# Patient Record
Sex: Female | Born: 1990
Health system: Southern US, Community
[De-identification: ages and names within clinical notes are randomized; demographics above are authoritative.]

## PROBLEM LIST (undated history)

## (undated) ENCOUNTER — Inpatient Hospital Stay (HOSPITAL_COMMUNITY): Payer: Self-pay

## (undated) DIAGNOSIS — J45909 Unspecified asthma, uncomplicated: Secondary | ICD-10-CM

## (undated) HISTORY — PX: NO PAST SURGERIES: SHX2092

---

## 2020-01-29 ENCOUNTER — Encounter (HOSPITAL_COMMUNITY): Payer: Self-pay

## 2020-01-29 ENCOUNTER — Other Ambulatory Visit: Payer: Self-pay

## 2020-01-29 ENCOUNTER — Ambulatory Visit (HOSPITAL_COMMUNITY)
Admission: EM | Admit: 2020-01-29 | Discharge: 2020-01-29 | Disposition: A | Discharge status: Home / Self Care | Payer: Federal, State, Local not specified - PPO | Attending: Emergency Medicine | Admitting: Emergency Medicine

## 2020-01-29 DIAGNOSIS — M109 Gout, unspecified: Secondary | ICD-10-CM

## 2020-01-29 HISTORY — DX: Unspecified asthma, uncomplicated: J45.909

## 2020-01-29 MED ORDER — METHYLPREDNISOLONE SODIUM SUCC 125 MG IJ SOLR
125.0000 mg | Freq: Once | INTRAMUSCULAR | Status: AC
  Administered 2020-01-29: 125 mg via INTRAMUSCULAR

## 2020-01-29 MED ORDER — METHYLPREDNISOLONE SODIUM SUCC 125 MG IJ SOLR
INTRAMUSCULAR | Status: AC
  Filled 2020-01-29: qty 2

## 2020-01-29 MED ORDER — KETOROLAC TROMETHAMINE 60 MG/2ML IM SOLN
INTRAMUSCULAR | Status: AC
  Filled 2020-01-29: qty 2

## 2020-01-29 MED ORDER — KETOROLAC TROMETHAMINE 60 MG/2ML IM SOLN
60.0000 mg | Freq: Once | INTRAMUSCULAR | Status: AC
  Administered 2020-01-29: 60 mg via INTRAMUSCULAR

## 2020-01-29 MED ORDER — PREDNISONE 10 MG (21) PO TBPK
ORAL_TABLET | ORAL | 0 refills | Status: DC
Start: 2020-01-29 — End: 2020-06-30

## 2020-01-29 MED ORDER — IBUPROFEN 600 MG PO TABS
600.0000 mg | ORAL_TABLET | Freq: Four times a day (QID) | ORAL | 0 refills | Status: DC | PRN
Start: 2020-01-29 — End: 2020-06-30

## 2020-01-29 NOTE — ED Provider Notes (Signed)
[facility]    CSN: [id number] Arrival date & time: 01/29/20  1728      History   Chief Complaint Chief Complaint  Patient presents with  . Foot Pain    HPI Stephanie Santos is a 29 y.o. female.   Who presented to the urgent care with a complaint of left ankle pain for the past 2 days.   Denies any precipitating event.  Localized the pain to the left ankle.  Described the pain as constant and achy, rated at 10 on a scale 1-10.  Has not tried any OTC medication..  Her symptoms are made worse with range of motion.  Denies chills, fever, nausea, vomiting, diarrhea.  The history is provided by the patient. No language interpreter was used.  Foot Pain    Past Medical History:  Diagnosis Date  . Asthma     There are no problems to display for this patient.   History reviewed. No pertinent surgical history.  OB History   No obstetric history on file.      Home Medications    Prior to Admission medications   Medication Sig Start Date End Date Taking? Authorizing Provider  ibuprofen (ADVIL) 600 MG tablet Take 1 tablet (600 mg total) by mouth every 6 (six) hours as needed. 01/29/20   Daylen Hack, Zachery Dakins, FNP  predniSONE (STERAPRED UNI-PAK 21 TAB) 10 MG (21) TBPK tablet Take 6 tabs by mouth daily  for 2 days, then 5 tabs for 2 days, then 4 tabs for 2 days, then 3 tabs for 2 days, 2 tabs for 2 days, then 1 tab by mouth daily for 2 days 01/29/20   Durward Parcel, FNP    Family History Family History  Problem Relation Age of Onset  . Cancer Mother     Social History Social History   Tobacco Use  . Smoking status: Never Smoker  . Smokeless tobacco: Never Used  Substance Use Topics  . Alcohol use: Never  . Drug use: Never     Allergies   Patient has no known allergies.   Review of Systems Review of Systems  Constitutional: Negative.   Respiratory: Negative.   Cardiovascular: Negative.   Musculoskeletal: Positive for arthralgias.  All other systems  reviewed and are negative.    Physical Exam Triage Vital Signs ED Triage Vitals  Enc Vitals Group     BP 01/29/20 1821 126/79     Pulse Rate 01/29/20 1821 81     Resp 01/29/20 1821 18     Temp 01/29/20 1821 98.9 F (37.2 C)     Temp Source 01/29/20 1821 Oral     SpO2 01/29/20 1821 100 %     Weight --      Height --      Head Circumference --      Peak Flow --      Pain Score 01/29/20 1820 8     Pain Loc --      Pain Edu? --      Excl. in GC? --    No data found.  Updated Vital Signs BP 126/79 (BP Location: Left Arm)   Pulse 81   Temp 98.9 F (37.2 C) (Oral)   Resp 18   LMP 01/03/2020   SpO2 100%   Visual Acuity Right Eye Distance:   Left Eye Distance:   Bilateral Distance:    Right Eye Near:   Left Eye Near:    Bilateral Near:     Physical  Exam Vitals and nursing note reviewed.  Constitutional:      General: She is not in acute distress.    Appearance: Normal appearance. She is normal weight. She is not ill-appearing, toxic-appearing or diaphoretic.  Cardiovascular:     Rate and Rhythm: Normal rate and regular rhythm.     Pulses: Normal pulses.     Heart sounds: Normal heart sounds. No murmur. No friction rub. No gallop.   Pulmonary:     Effort: Pulmonary effort is normal. No respiratory distress.     Breath sounds: Normal breath sounds. No stridor. No wheezing, rhonchi or rales.  Chest:     Chest wall: No tenderness.  Musculoskeletal:        General: Tenderness present.     Right ankle: Normal.     Left ankle: Swelling present. No ecchymosis or lacerations. Tenderness present.     Comments: Patient is unable to bear weight.  Left ankle is with deformity when compared to the right ankle.  There is warmth present to touch.  Patient is unable to complete range of motion due to pain.  Neurovascular status is intact.  Neurological:     Mental Status: She is alert.      UC Treatments / Results  Labs (all labs ordered are listed, but only abnormal  results are displayed) Labs Reviewed - No data to display  EKG   Radiology No results found.  Procedures Procedures (including critical care time)  Medications Ordered in UC Medications  methylPREDNISolone sodium succinate (SOLU-MEDROL) 125 mg/2 mL injection 125 mg (has no administration in time range)  ketorolac (TORADOL) injection 60 mg (has no administration in time range)    Initial Impression / Assessment and Plan / UC Course  I have reviewed the triage vital signs and the nursing notes.  Pertinent labs & imaging results that were available during my care of the patient were reviewed by me and considered in my medical decision making (see chart for details).   Patient is stable for discharge.  Symptom is likely from gout.  Will prescribe prednisone and ibuprofen.  Follow-up with PCP  Final Clinical Impressions(s) / UC Diagnoses   Final diagnoses:  Acute gout of left ankle, unspecified cause     Discharge Instructions     Prescribed prednisone take as directed and to completion Prescribed ibuprofen Primary care provider assistance initiated to establish care Follow up with PCP for further evaluation and management Return or go to the ED if you have any new or worsening symptoms     ED Prescriptions    Medication Sig Dispense Auth. Provider   predniSONE (STERAPRED UNI-PAK 21 TAB) 10 MG (21) TBPK tablet Take 6 tabs by mouth daily  for 2 days, then 5 tabs for 2 days, then 4 tabs for 2 days, then 3 tabs for 2 days, 2 tabs for 2 days, then 1 tab by mouth daily for 2 days 42 tablet Thermon Zulauf S, FNP   ibuprofen (ADVIL) 600 MG tablet Take 1 tablet (600 mg total) by mouth every 6 (six) hours as needed. 30 tablet Junia Nygren, Darrelyn Hillock, FNP     PDMP not reviewed this encounter.   Emerson Monte, FNP 01/29/20 1927

## 2020-01-29 NOTE — Discharge Instructions (Addendum)
Prescribed prednisone take as directed and to completion Prescribed ibuprofen Primary care provider assistance initiated to establish care Follow up with PCP for further evaluation and management Return or go to the ED if you have any new or worsening symptoms

## 2020-01-29 NOTE — ED Triage Notes (Signed)
Pt c/o left ankle/foot pain since yesterday. Denies any known trauma/injury. Left foot/ankle edematous. Pt reports that she is unable to bear weight to foot/ankle. Has NOT taken any pain relievers/NSAIDs.

## 2020-02-02 ENCOUNTER — Other Ambulatory Visit: Payer: Self-pay

## 2020-02-02 ENCOUNTER — Emergency Department (HOSPITAL_COMMUNITY)
Admission: EM | Admit: 2020-02-02 | Discharge: 2020-02-03 | Disposition: A | Discharge status: Home / Self Care | Payer: Federal, State, Local not specified - PPO | Attending: Emergency Medicine | Admitting: Emergency Medicine

## 2020-02-02 ENCOUNTER — Emergency Department (HOSPITAL_COMMUNITY): Payer: Federal, State, Local not specified - PPO

## 2020-02-02 ENCOUNTER — Encounter (HOSPITAL_COMMUNITY): Payer: Self-pay | Admitting: Emergency Medicine

## 2020-02-02 DIAGNOSIS — R079 Chest pain, unspecified: Secondary | ICD-10-CM | POA: Insufficient documentation

## 2020-02-02 DIAGNOSIS — Z5321 Procedure and treatment not carried out due to patient leaving prior to being seen by health care provider: Secondary | ICD-10-CM | POA: Diagnosis not present

## 2020-02-02 LAB — BASIC METABOLIC PANEL
Anion gap: 10 (ref 5–15)
BUN: 14 mg/dL (ref 6–20)
CO2: 26 mmol/L (ref 22–32)
Calcium: 8.6 mg/dL — ABNORMAL LOW (ref 8.9–10.3)
Chloride: 102 mmol/L (ref 98–111)
Creatinine, Ser: 0.9 mg/dL (ref 0.44–1.00)
GFR calc Af Amer: >60 mL/min (ref >60)
GFR calc non Af Amer: >60 mL/min (ref >60)
Glucose, Bld: 126 mg/dL — ABNORMAL HIGH (ref 70–99)
Potassium: 3.8 mmol/L (ref 3.5–5.1)
Sodium: 138 mmol/L (ref 135–145)

## 2020-02-02 LAB — TROPONIN I (HIGH SENSITIVITY): Troponin I (High Sensitivity): <2 ng/L (ref <18)

## 2020-02-02 LAB — CBC
HCT: 34.6 % — ABNORMAL LOW (ref 36.0–46.0)
Hemoglobin: 10.5 g/dL — ABNORMAL LOW (ref 12.0–15.0)
MCH: 21.2 pg — ABNORMAL LOW (ref 26.0–34.0)
MCHC: 30.3 g/dL (ref 30.0–36.0)
MCV: 69.9 fL — ABNORMAL LOW (ref 80.0–100.0)
Platelets: 215 10*3/uL (ref 150–400)
RBC: 4.95 MIL/uL (ref 3.87–5.11)
RDW: 18.6 % — ABNORMAL HIGH (ref 11.5–15.5)
WBC: 8.3 10*3/uL (ref 4.0–10.5)
nRBC: 0 % (ref 0.0–0.2)

## 2020-02-02 LAB — I-STAT BETA HCG BLOOD, ED (MC, WL, AP ONLY): I-stat hCG, quantitative: <5 m[IU]/mL (ref <5)

## 2020-02-02 MED ORDER — SODIUM CHLORIDE 0.9% FLUSH: 3.0000 mL | Freq: Once | INTRAVENOUS | Status: DC

## 2020-02-02 NOTE — ED Triage Notes (Signed)
Pt c/o intermittent chest pain x 2 days. Denies shortness of breath.

## 2020-02-03 NOTE — ED Notes (Signed)
Patient stated that she can no longer wait , left the ER .

## 2020-03-30 ENCOUNTER — Other Ambulatory Visit: Payer: Self-pay

## 2020-03-30 ENCOUNTER — Encounter (HOSPITAL_COMMUNITY): Payer: Self-pay | Admitting: Emergency Medicine

## 2020-03-30 DIAGNOSIS — T192XXA Foreign body in vulva and vagina, initial encounter: Secondary | ICD-10-CM | POA: Insufficient documentation

## 2020-03-30 DIAGNOSIS — Y999 Unspecified external cause status: Secondary | ICD-10-CM | POA: Insufficient documentation

## 2020-03-30 DIAGNOSIS — J45909 Unspecified asthma, uncomplicated: Secondary | ICD-10-CM | POA: Insufficient documentation

## 2020-03-30 DIAGNOSIS — Y939 Activity, unspecified: Secondary | ICD-10-CM | POA: Diagnosis not present

## 2020-03-30 DIAGNOSIS — Y9289 Other specified places as the place of occurrence of the external cause: Secondary | ICD-10-CM | POA: Insufficient documentation

## 2020-03-30 DIAGNOSIS — W458XXA Other foreign body or object entering through skin, initial encounter: Secondary | ICD-10-CM | POA: Diagnosis not present

## 2020-03-30 NOTE — ED Triage Notes (Signed)
Patient reports tampon in vagina since 1500. States cannot find string or feel it.

## 2020-03-31 ENCOUNTER — Emergency Department (HOSPITAL_COMMUNITY)
Admission: EM | Admit: 2020-03-31 | Discharge: 2020-03-31 | Disposition: A | Discharge status: Home / Self Care | Payer: Federal, State, Local not specified - PPO | Attending: Emergency Medicine | Admitting: Emergency Medicine

## 2020-03-31 DIAGNOSIS — Z01419 Encounter for gynecological examination (general) (routine) without abnormal findings: Secondary | ICD-10-CM

## 2020-03-31 NOTE — ED Provider Notes (Signed)
[facility]-EMERGENCY DEPT Provider Note   CSN: [id number] Arrival date & time: 03/30/20  2148     History Chief Complaint  Patient presents with  . Foreign Body in Vagina    Stephanie Santos is a 29 y.o. female.  Patient to ED concerned for retained tampon in vaginal vault since around 3:00 yesterday afternoon. No vaginal discharge, odor or pain.  The history is provided by the patient. No language interpreter was used.  Foreign Body in Vagina This is a new problem. The current episode started 6 to 12 hours ago. The problem has not changed since onset.Pertinent negatives include no abdominal pain. Nothing aggravates the symptoms. Nothing relieves the symptoms. She has tried nothing for the symptoms.       Past Medical History:  Diagnosis Date  . Asthma     There are no problems to display for this patient.   History reviewed. No pertinent surgical history.   OB History   No obstetric history on file.     Family History  Problem Relation Age of Onset  . Cancer Mother     Social History   Tobacco Use  . Smoking status: Never Smoker  . Smokeless tobacco: Never Used  Vaping Use  . Vaping Use: Never used  Substance Use Topics  . Alcohol use: Never  . Drug use: Never    Home Medications Prior to Admission medications   Medication Sig Start Date End Date Taking? Authorizing Provider  ibuprofen (ADVIL) 600 MG tablet Take 1 tablet (600 mg total) by mouth every 6 (six) hours as needed. 01/29/20   Avegno, Zachery Dakins, FNP  predniSONE (STERAPRED UNI-PAK 21 TAB) 10 MG (21) TBPK tablet Take 6 tabs by mouth daily  for 2 days, then 5 tabs for 2 days, then 4 tabs for 2 days, then 3 tabs for 2 days, 2 tabs for 2 days, then 1 tab by mouth daily for 2 days 01/29/20   Durward Parcel, FNP    Allergies    Patient has no known allergies.  Review of Systems   Review of Systems  Gastrointestinal: Negative for abdominal pain.  Genitourinary: Negative for  pelvic pain and vaginal discharge.       See HPI.    Physical Exam Updated Vital Signs BP 122/69 (BP Location: Left Arm)   Pulse 62   Temp 97.9 F (36.6 C) (Oral)   Resp 14   LMP 03/30/2020   SpO2 100%   Physical Exam Constitutional:      Appearance: She is well-developed.  Pulmonary:     Effort: Pulmonary effort is normal.  Genitourinary:    Comments: Minimal cervical bleeding c/w menses. No foreign body observed in vaginal vault.  Musculoskeletal:     Cervical back: Normal range of motion.  Skin:    General: Skin is warm and dry.  Neurological:     Mental Status: She is alert and oriented to person, place, and time.     ED Results / Procedures / Treatments   Labs (all labs ordered are listed, but only abnormal results are displayed) Labs Reviewed - No data to display  EKG None  Radiology No results found.  Procedures Procedures (including critical care time)  Medications Ordered in ED Medications - No data to display  ED Course  I have reviewed the triage vital signs and the nursing notes.  Pertinent labs & imaging results that were available during my care of the patient were reviewed by me and  considered in my medical decision making (see chart for details).    MDM Rules/Calculators/A&P                          1. Well exam  Patient to ED with concern for vaginal FB. No FB on exam. No evidence of infection.   Final Clinical Impression(s) / ED Diagnoses Final diagnoses:  None    Rx / DC Orders ED Discharge Orders    None       Danne Harbor 03/31/20 0631    Palumbo, April, MD 03/31/20 [phone]

## 2020-03-31 NOTE — Discharge Instructions (Addendum)
Return to the emergency department or see your gynecologist if you develop any symptoms of vaginal discharge, vaginal odor, pain or fever. You are able to continue to use tampons without concern.

## 2020-06-30 ENCOUNTER — Encounter: Payer: Self-pay | Admitting: Family Medicine

## 2020-06-30 ENCOUNTER — Ambulatory Visit (INDEPENDENT_AMBULATORY_CARE_PROVIDER_SITE_OTHER): Payer: Federal, State, Local not specified - PPO | Admitting: Family Medicine

## 2020-06-30 ENCOUNTER — Other Ambulatory Visit: Payer: Self-pay

## 2020-06-30 VITALS — BP 120/70 | HR 76 | Ht 66.75 in | Wt 246.4 lb

## 2020-06-30 DIAGNOSIS — Z833 Family history of diabetes mellitus: Secondary | ICD-10-CM | POA: Insufficient documentation

## 2020-06-30 DIAGNOSIS — Z114 Encounter for screening for human immunodeficiency virus [HIV]: Secondary | ICD-10-CM

## 2020-06-30 DIAGNOSIS — Z0000 Encounter for general adult medical examination without abnormal findings: Secondary | ICD-10-CM | POA: Diagnosis not present

## 2020-06-30 DIAGNOSIS — E669 Obesity, unspecified: Secondary | ICD-10-CM | POA: Insufficient documentation

## 2020-06-30 DIAGNOSIS — Z1159 Encounter for screening for other viral diseases: Secondary | ICD-10-CM

## 2020-06-30 DIAGNOSIS — J45909 Unspecified asthma, uncomplicated: Secondary | ICD-10-CM | POA: Insufficient documentation

## 2020-06-30 HISTORY — DX: Obesity, unspecified: E66.9

## 2020-06-30 NOTE — Progress Notes (Signed)
Subjective:    Patient ID: Stephanie Santos, female    DOB: 11-12-1990, 29 y.o.   MRN: [id number]  HPI Chief Complaint  Patient presents with  . new pt    new pt, cpe, doesn't have a obgyn- just got insurance. no concerns. last pap smear 2020- [facility]- normal, declines flu shot   She is new to the practice and here for a complete physical exam. Previous medical care: no regular medical care  Last CPE: years   Other providers: [facility] Wants an OB/GYN  She would like to discuss how to lose weight.   Asthma since childhood but years since it was an issue.    Social history: has a boyfriend, lives alone, works in distribution center  Denies smoking, drinking alcohol, drug use  Diet: has cut back on fried foods, fast foods. Does not eat after 8 pm mostly  Excerise: nothing outside of woek   Immunizations: Tdap in the past 10 years.   Health maintenance:  Mammogram: N/A Colonoscopy: N/A Last Gynecological Exam: pap smear last year in [facility] and normal per patient.  Last Menstrual cycle: 06/06/2020 Pregnancies: 0 Last Dental Exam:  Has appt next week.  Last Eye Exam: June 2021   Wears seatbelt always, smoke detectors in home and functioning, does not text while driving and feels safe in home environment.   Reviewed allergies, medications, past medical, surgical, family, and social history.     Review of Systems Review of Systems Constitutional: -fever, -chills, -sweats, -unexpected weight change,-fatigue ENT: -runny nose, -ear pain, -sore throat Cardiology:  -chest pain, -palpitations, -edema Respiratory: -cough, -shortness of breath, -wheezing Gastroenterology: -abdominal pain, -nausea, -vomiting, -diarrhea, -constipation  Hematology: -bleeding or bruising problems Musculoskeletal: -arthralgias, -myalgias, -joint swelling, -back pain Ophthalmology: -vision changes Urology: -dysuria, -difficulty urinating, -hematuria, -urinary  frequency, -urgency Neurology: -headache, -weakness, -tingling, -numbness       Objective:   Physical Exam BP 120/70   Pulse 76   Ht 5' 6.75" (1.695 m)   Wt 246 lb 6.4 oz (111.8 kg)   LMP 06/06/2020   BMI 38.88 kg/m   General Appearance:    Alert, cooperative, no distress, appears stated age  Head:    Normocephalic, without obvious abnormality, atraumatic  Eyes:    PERRL, conjunctiva/corneas clear, EOM's intact  Ears:    Normal TM's and external ear canals  Nose:   Mask on   Throat:   Mask on   Neck:   Supple, no lymphadenopathy;  thyroid:  no   enlargement/tenderness/nodules; no JVD  Back:    Spine nontender, no curvature, ROM normal, no CVA     tenderness  Lungs:     Clear to auscultation bilaterally without wheezes, rales or     ronchi; respirations unlabored  Chest Wall:    No tenderness or deformity   Heart:    Regular rate and rhythm, S1 and S2 normal, no murmur, rub   or gallop  Breast Exam:    OB/GYN  Abdomen:     Soft, non-tender, nondistended, normoactive bowel sounds,    no masses, no hepatosplenomegaly  Genitalia:    OB/GYN  Rectal:    Not performed due to age<40 and no related complaints  Extremities:   No clubbing, cyanosis or edema  Pulses:   2+ and symmetric all extremities  Skin:   Skin color, texture, turgor normal, no rashes or lesions  Lymph nodes:   Cervical, supraclavicular, and axillary nodes normal  Neurologic:  CNII-XII intact, normal strength, sensation and gait; reflexes 2+ and symmetric throughout          Psych:   Normal mood, affect, hygiene and grooming.        Assessment & Plan:  Routine general medical examination at a health care facility - Plan: CBC with Differential/Platelet, Comprehensive metabolic panel, TSH, T4, free, T3, Lipid panel -She is new to the practice and here today for a fasting CPE.  Preventive health care reviewed.  Pap smear up-to-date per patient.  She would like to establish with an OB/GYN and I will give her a list of  offices.  She is aware that she will need to call and schedule.  Recommend she continue getting regular dental and eye exams.  Counseling on healthy lifestyle including diet and exercise.  Immunizations reviewed.  Reports Tdap up-to-date, I do not have a record of this.  Declines flu and Covid vaccines and I did counsel her on the benefits and risks. Discussed safety and health promotion.  Obesity (BMI 30-39.9) - Plan: TSH, T4, free, T3, Lipid panel, Hemoglobin A1c -In-depth counseling on potential long-term health consequences associated with obesity and the fact that she has multiple family members with diabetes also increases her risk.  I will screen her for diabetes today. Recommend cutting back on carbohydrates and calories.  Advised she may use a free app such as my fitness pal to track these.  Encouraged increased physical activity.  I will challenge her to lose a pound a week and follow-up with me in 3 months.  She was agreeable to this.  Family history of diabetes mellitus in mother - Plan: Hemoglobin A1c  Need for hepatitis C screening test - Plan: Hepatitis C antibody -Screening done per guidelines  Screening for HIV without presence of risk factors - Plan: HIV Antibody (routine testing w rflx) -Screening done per guidelines

## 2020-06-30 NOTE — Patient Instructions (Signed)
Obgyn Offices:  ° °[facility] °[address] °[address] °[place], [place] [address] °[phone] ° °[facility] °Address: [address] °  [place], [place] [address] °Phone: [phone] ° °[facility] °[address] °[address] °[place], [place] [address] °Phone: [phone] ° °[facility] °[address] °[place], [place] [address] °Phone: [phone] ° °Try using a free app such as My Fitness Pal to track your calories and carbohydrates.  ° °Limit foods high in carbohydrates such as rice, bread, pasta, potatoes and sugar.  ° ° ° ° °Preventive Care 21-39 Years Old, Female °Preventive care refers to visits with your health care provider and lifestyle choices that can promote health and wellness. This includes: °· A yearly physical exam. This may also be called an annual well check. °· Regular dental visits and eye exams. °· Immunizations. °· Screening for certain conditions. °· Healthy lifestyle choices, such as eating a healthy diet, getting regular exercise, not using drugs or products that contain nicotine and tobacco, and limiting alcohol use. °What can I expect for my preventive care visit? °Physical exam °Your health care provider will check your: °· Height and weight. This may be used to calculate body mass index (BMI), which tells if you are at a healthy weight. °· Heart rate and blood pressure. °· Skin for abnormal spots. °Counseling °Your health care provider may ask you questions about your: °· Alcohol, tobacco, and drug use. °· Emotional well-being. °· Home and relationship well-being. °· Sexual activity. °· Eating habits. °· Work and work environment. °· Method of birth control. °· Menstrual cycle. °· Pregnancy history. °What immunizations do I need? ° °Influenza (flu) vaccine °· This is recommended every year. °Tetanus, diphtheria, and pertussis (Tdap) vaccine °· You may need a Td booster every 10 years. °Varicella (chickenpox)  vaccine °· You may need this if you have not been vaccinated. °Human papillomavirus (HPV) vaccine °· If recommended by your health care provider, you may need three doses over 6 months. °Measles, mumps, and rubella (MMR) vaccine °· You may need at least one dose of MMR. You may also need a second dose. °Meningococcal conjugate (MenACWY) vaccine °· One dose is recommended if you are age 19-21 years and a first-year college student living in a residence hall, or if you have one of several medical conditions. You may also need additional booster doses. °Pneumococcal conjugate (PCV13) vaccine °· You may need this if you have certain conditions and were not previously vaccinated. °Pneumococcal polysaccharide (PPSV23) vaccine °· You may need one or two doses if you smoke cigarettes or if you have certain conditions. °Hepatitis A vaccine °· You may need this if you have certain conditions or if you travel or work in places where you may be exposed to hepatitis A. °Hepatitis B vaccine °· You may need this if you have certain conditions or if you travel or work in places where you may be exposed to hepatitis B. °Haemophilus influenzae type b (Hib) vaccine °· You may need this if you have certain conditions. °You may receive vaccines as individual doses or as more than one vaccine together in one shot (combination vaccines). Talk with your health care provider about the risks and benefits of combination vaccines. °What tests do I need? ° °Blood tests °· Lipid and cholesterol levels. These may be checked every 5 years starting at age 20. °· Hepatitis C test. °· Hepatitis B test. °Screening °· Diabetes screening. This is done by   checking your blood sugar (glucose) after you have not eaten for a while (fasting).  Sexually transmitted disease (STD) testing.  BRCA-related cancer screening. This may be done if you have a family history of breast, ovarian, tubal, or peritoneal cancers.  Pelvic exam and Pap test. This may be  done every 3 years starting at age 55. Starting at age 46, this may be done every 5 years if you have a Pap test in combination with an HPV test. Talk with your health care provider about your test results, treatment options, and if necessary, the need for more tests. Follow these instructions at home: Eating and drinking   Eat a diet that includes fresh fruits and vegetables, whole grains, lean protein, and low-fat dairy.  Take vitamin and mineral supplements as recommended by your health care provider.  Do not drink alcohol if: ? Your health care provider tells you not to drink. ? You are pregnant, may be pregnant, or are planning to become pregnant.  If you drink alcohol: ? Limit how much you have to 0-1 drink a day. ? Be aware of how much alcohol is in your drink. In the [place] one drink equals one 12 oz bottle of beer (355 mL), one 5 oz glass of wine (148 mL), or one 1 oz glass of hard liquor (44 mL). Lifestyle  Take daily care of your teeth and gums.  Stay active. Exercise for at least 30 minutes on 5 or more days each week.  Do not use any products that contain nicotine or tobacco, such as cigarettes, e-cigarettes, and chewing tobacco. If you need help quitting, ask your health care provider.  If you are sexually active, practice safe sex. Use a condom or other form of birth control (contraception) in order to prevent pregnancy and STIs (sexually transmitted infections). If you plan to become pregnant, see your health care provider for a preconception visit. What's next?  Visit your health care provider once a year for a well check visit.  Ask your health care provider how often you should have your eyes and teeth checked.  Stay up to date on all vaccines. This information is not intended to replace advice given to you by your health care provider. Make sure you discuss any questions you have with your health care provider. Document Revised: 05/08/2018 Document Reviewed:  05/08/2018 Elsevier Patient Education  2020 [employer].

## 2020-07-01 ENCOUNTER — Encounter: Payer: Self-pay | Admitting: Family Medicine

## 2020-07-01 DIAGNOSIS — R7989 Other specified abnormal findings of blood chemistry: Secondary | ICD-10-CM | POA: Insufficient documentation

## 2020-07-01 DIAGNOSIS — R7303 Prediabetes: Secondary | ICD-10-CM

## 2020-07-01 DIAGNOSIS — D649 Anemia, unspecified: Secondary | ICD-10-CM

## 2020-07-01 HISTORY — DX: Anemia, unspecified: D64.9

## 2020-07-01 HISTORY — DX: Other specified abnormal findings of blood chemistry: R79.89

## 2020-07-01 HISTORY — DX: Prediabetes: R73.03

## 2020-07-01 LAB — COMPREHENSIVE METABOLIC PANEL
ALT: 8 IU/L (ref 0–32)
AST: 13 IU/L (ref 0–40)
Albumin/Globulin Ratio: 1.3 (ref 1.2–2.2)
Albumin: 3.5 g/dL — ABNORMAL LOW (ref 3.9–5.0)
Alkaline Phosphatase: 69 IU/L (ref 44–121)
BUN/Creatinine Ratio: 18 (ref 9–23)
BUN: 15 mg/dL (ref 6–20)
Bilirubin Total: 0.3 mg/dL (ref 0.0–1.2)
CO2: 21 mmol/L (ref 20–29)
Calcium: 8.4 mg/dL — ABNORMAL LOW (ref 8.7–10.2)
Chloride: 106 mmol/L (ref 96–106)
Creatinine, Ser: 0.82 mg/dL (ref 0.57–1.00)
GFR calc Af Amer: 111 mL/min/{1.73_m2} (ref >59)
GFR calc non Af Amer: 96 mL/min/{1.73_m2} (ref >59)
Globulin, Total: 2.7 g/dL (ref 1.5–4.5)
Glucose: 90 mg/dL (ref 65–99)
Potassium: 4.8 mmol/L (ref 3.5–5.2)
Sodium: 139 mmol/L (ref 134–144)
Total Protein: 6.2 g/dL (ref 6.0–8.5)

## 2020-07-01 LAB — CBC WITH DIFFERENTIAL/PLATELET
Basophils Absolute: 0 10*3/uL (ref 0.0–0.2)
Basos: 1 %
EOS (ABSOLUTE): 0.1 10*3/uL (ref 0.0–0.4)
Eos: 2 %
Hematocrit: 35.2 % (ref 34.0–46.6)
Hemoglobin: 10.6 g/dL — ABNORMAL LOW (ref 11.1–15.9)
Immature Grans (Abs): 0 10*3/uL (ref 0.0–0.1)
Immature Granulocytes: 0 %
Lymphocytes Absolute: 1.7 10*3/uL (ref 0.7–3.1)
Lymphs: 32 %
MCH: 20.7 pg — ABNORMAL LOW (ref 26.6–33.0)
MCHC: 30.1 g/dL — ABNORMAL LOW (ref 31.5–35.7)
MCV: 69 fL — ABNORMAL LOW (ref 79–97)
Monocytes Absolute: 0.4 10*3/uL (ref 0.1–0.9)
Monocytes: 7 %
Neutrophils Absolute: 3.1 10*3/uL (ref 1.4–7.0)
Neutrophils: 58 %
Platelets: 179 10*3/uL (ref 150–450)
RBC: 5.11 x10E6/uL (ref 3.77–5.28)
RDW: 17.6 % — ABNORMAL HIGH (ref 11.7–15.4)
WBC: 5.4 10*3/uL (ref 3.4–10.8)

## 2020-07-01 LAB — T3: T3, Total: 112 ng/dL (ref 71–180)

## 2020-07-01 LAB — HEMOGLOBIN A1C
Est. average glucose Bld gHb Est-mCnc: 120 mg/dL
Hgb A1c MFr Bld: 5.8 % — ABNORMAL HIGH (ref 4.8–5.6)

## 2020-07-01 LAB — LIPID PANEL
Chol/HDL Ratio: 3.1 ratio (ref 0.0–4.4)
Cholesterol, Total: 160 mg/dL (ref 100–199)
HDL: 52 mg/dL (ref >39)
LDL Chol Calc (NIH): 100 mg/dL — ABNORMAL HIGH (ref 0–99)
Triglycerides: 32 mg/dL (ref 0–149)
VLDL Cholesterol Cal: 8 mg/dL (ref 5–40)

## 2020-07-01 LAB — T4, FREE: Free T4: 0.88 ng/dL (ref 0.82–1.77)

## 2020-07-01 LAB — TSH: TSH: 1.67 u[IU]/mL (ref 0.450–4.500)

## 2020-07-01 LAB — HIV ANTIBODY (ROUTINE TESTING W REFLEX): HIV Screen 4th Generation wRfx: NONREACTIVE

## 2020-07-01 LAB — HEPATITIS C ANTIBODY: Hep C Virus Ab: <0.1 s/co ratio (ref 0.0–0.9)

## 2020-07-01 NOTE — Progress Notes (Signed)
Her labs show that she does have prediabetes.  I recommend that she cut back on sugar and carbohydrates to help prevent this from becoming full-blown diabetes. Her calcium and protein levels are also low.  I recommend that she eat a healthy diet with more calcium and protein.  I also recommend that she take a women's One-A-Day multivitamin. She also has mild anemia.  Recommend she check to make sure she is getting plenty of iron in her diet. Lets recheck her labs when she follows up with me in 3 months.  She does not have to be fasting.

## 2020-09-23 DIAGNOSIS — Z20822 Contact with and (suspected) exposure to covid-19: Secondary | ICD-10-CM | POA: Diagnosis not present

## 2020-09-23 DIAGNOSIS — J029 Acute pharyngitis, unspecified: Secondary | ICD-10-CM | POA: Diagnosis not present

## 2020-09-23 DIAGNOSIS — U071 COVID-19: Secondary | ICD-10-CM | POA: Diagnosis not present

## 2020-09-23 DIAGNOSIS — R52 Pain, unspecified: Secondary | ICD-10-CM | POA: Diagnosis not present

## 2020-09-28 ENCOUNTER — Ambulatory Visit: Payer: Federal, State, Local not specified - PPO | Admitting: Family Medicine

## 2020-10-13 DIAGNOSIS — R5383 Other fatigue: Secondary | ICD-10-CM | POA: Diagnosis not present

## 2020-10-13 DIAGNOSIS — J029 Acute pharyngitis, unspecified: Secondary | ICD-10-CM | POA: Diagnosis not present

## 2020-10-13 DIAGNOSIS — R519 Headache, unspecified: Secondary | ICD-10-CM | POA: Diagnosis not present

## 2020-10-13 DIAGNOSIS — Z8616 Personal history of COVID-19: Secondary | ICD-10-CM | POA: Diagnosis not present

## 2020-10-15 IMAGING — CR DG CHEST 2V
2 series · 2 of 2 positions shown · non-contrast
Comparison: None.

CLINICAL DATA: Intermittent chest pain for 2 days

EXAM:
CHEST - 2 VIEW

[chest pa]
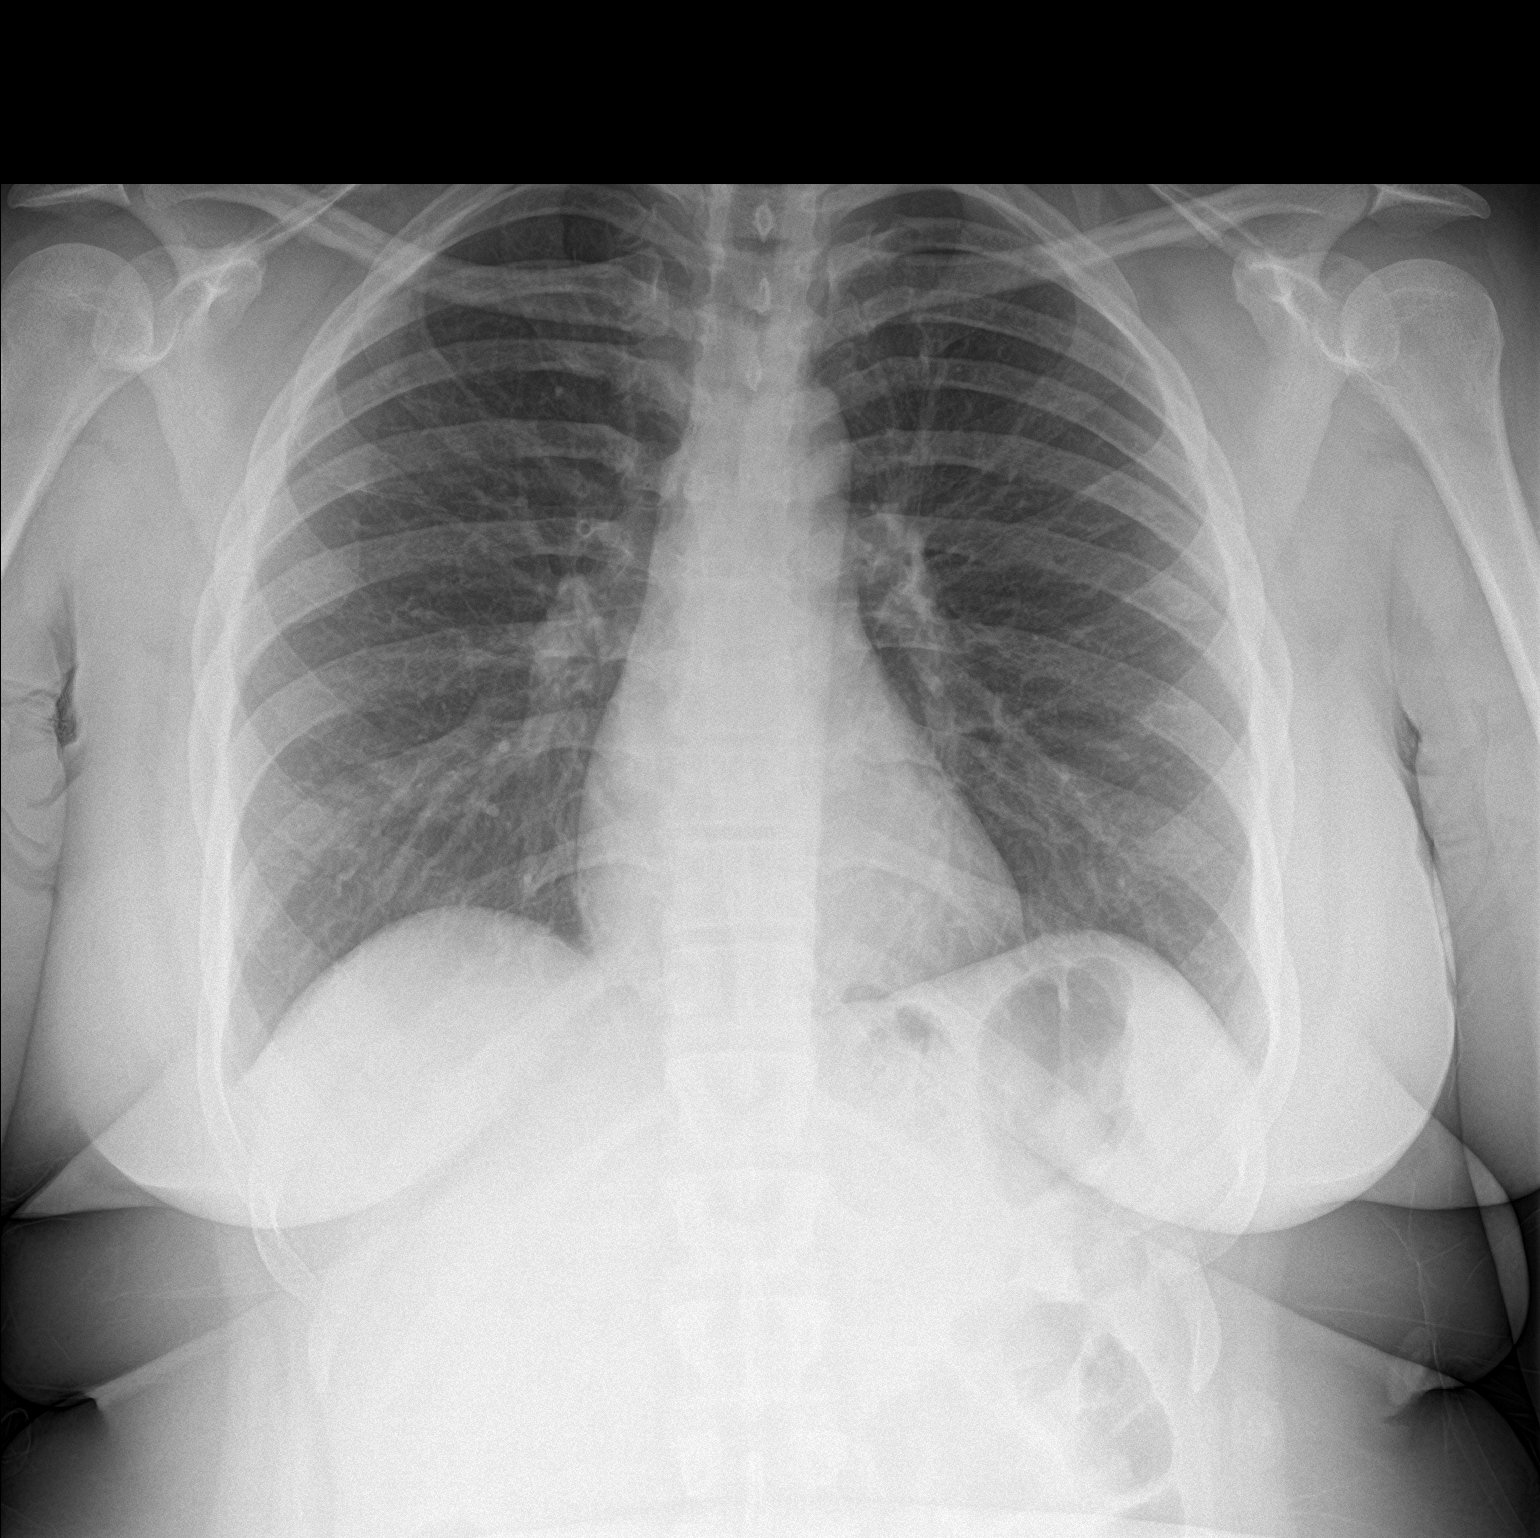

[chest lat]
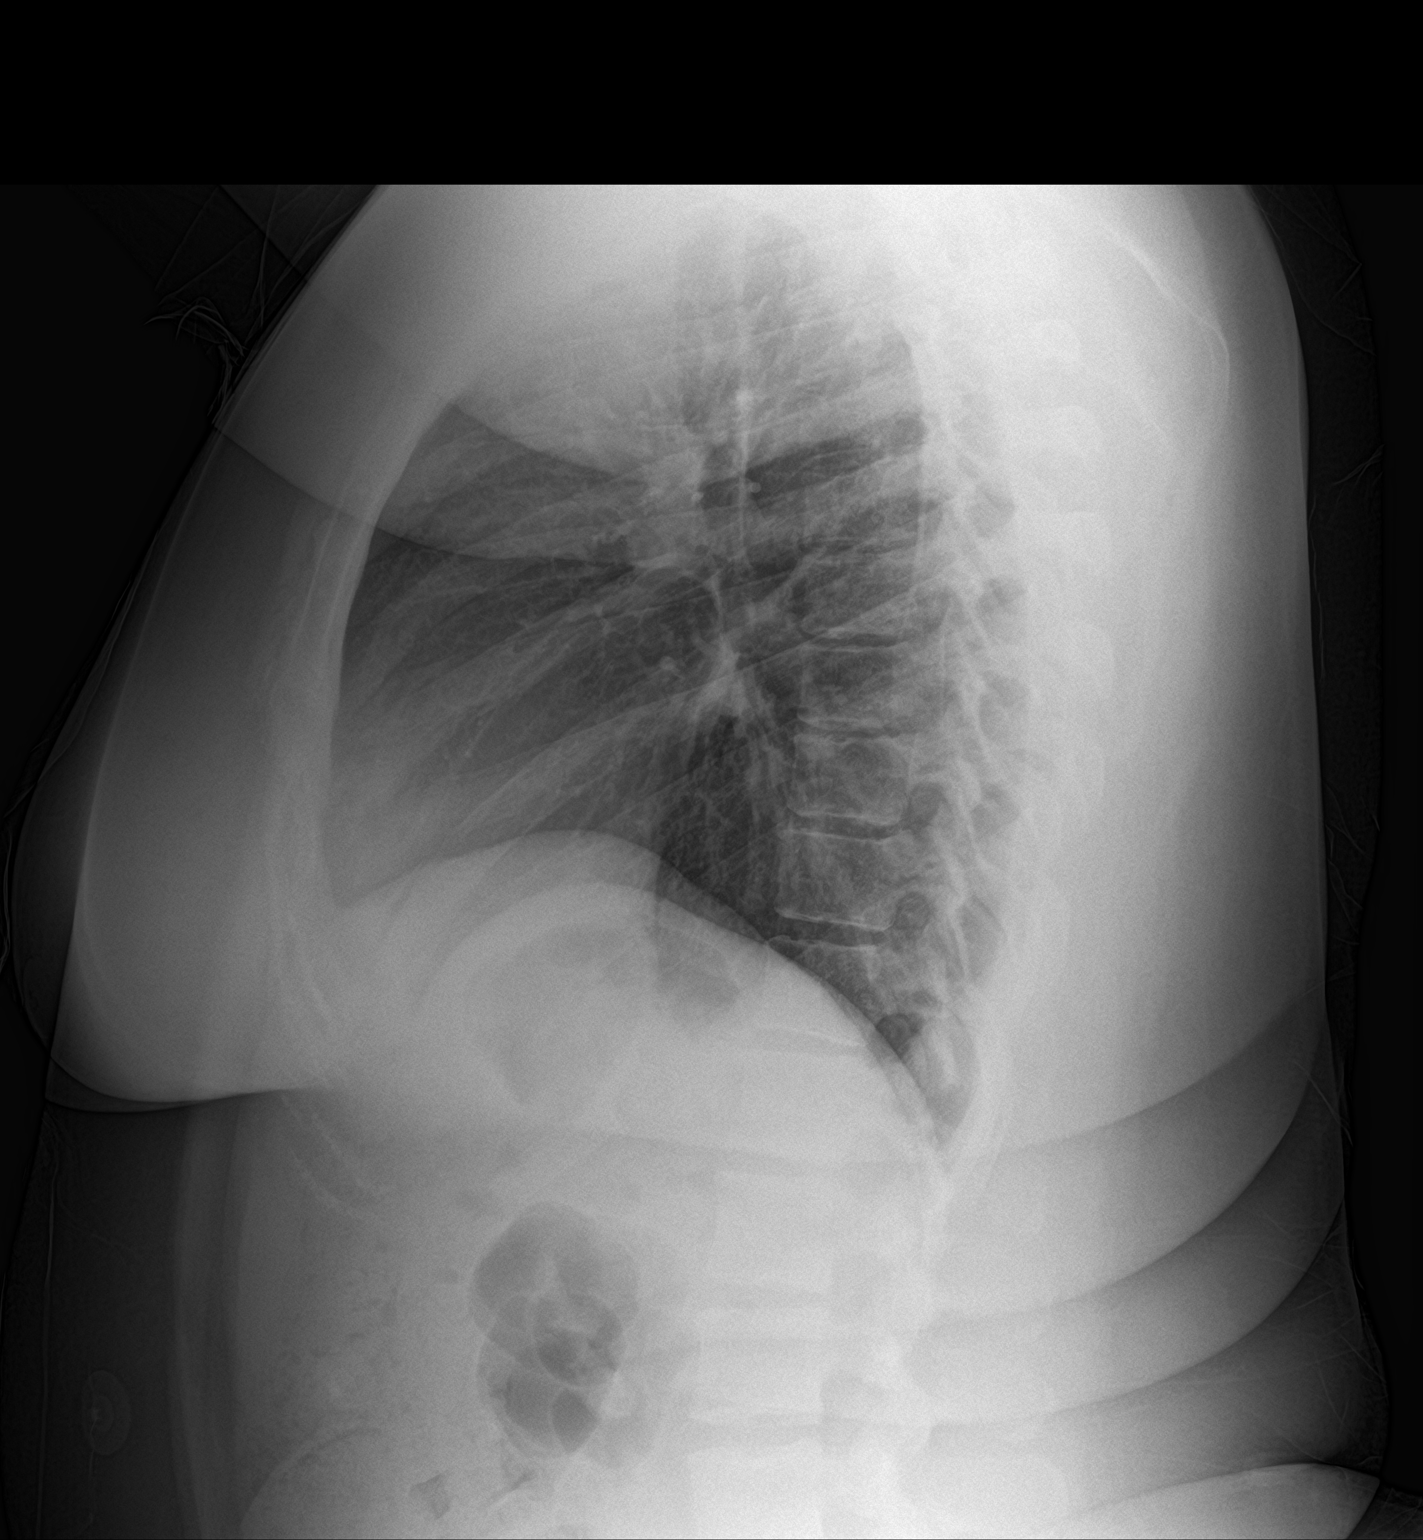

[2 of 2 positions shown; findings below may reference images not displayed]

FINDINGS: The heart size and mediastinal contours are within normal limits.
Both lungs are clear. The visualized skeletal structures are
unremarkable.
IMPRESSION: No active cardiopulmonary disease.

## 2020-10-25 ENCOUNTER — Encounter: Payer: Federal, State, Local not specified - PPO | Admitting: Nurse Practitioner

## 2020-11-11 ENCOUNTER — Encounter: Payer: Federal, State, Local not specified - PPO | Admitting: Nurse Practitioner

## 2020-11-14 ENCOUNTER — Other Ambulatory Visit: Payer: Self-pay

## 2020-11-14 ENCOUNTER — Ambulatory Visit: Payer: Federal, State, Local not specified - PPO | Admitting: Nurse Practitioner

## 2020-11-14 ENCOUNTER — Encounter: Payer: Self-pay | Admitting: Nurse Practitioner

## 2020-11-14 VITALS — BP 118/70 | Ht 65.0 in | Wt 244.0 lb

## 2020-11-14 DIAGNOSIS — Z113 Encounter for screening for infections with a predominantly sexual mode of transmission: Secondary | ICD-10-CM | POA: Diagnosis not present

## 2020-11-14 DIAGNOSIS — N979 Female infertility, unspecified: Secondary | ICD-10-CM

## 2020-11-14 DIAGNOSIS — Z01419 Encounter for gynecological examination (general) (routine) without abnormal findings: Secondary | ICD-10-CM | POA: Diagnosis not present

## 2020-11-14 DIAGNOSIS — R3915 Urgency of urination: Secondary | ICD-10-CM

## 2020-11-14 LAB — URINALYSIS, COMPLETE W/RFL CULTURE
Bacteria, UA: NONE SEEN /HPF
Bilirubin Urine: NEGATIVE
Glucose, UA: NEGATIVE
Hgb urine dipstick: NEGATIVE
Hyaline Cast: NONE SEEN /LPF
Ketones, ur: NEGATIVE
Leukocyte Esterase: NEGATIVE
Nitrites, Initial: NEGATIVE
Protein, ur: NEGATIVE
RBC / HPF: NONE SEEN /HPF (ref 0–2)
Specific Gravity, Urine: 1.025 (ref 1.001–1.03)
WBC, UA: NONE SEEN /HPF (ref 0–5)
pH: 6.5 (ref 5.0–8.0)

## 2020-11-14 LAB — NO CULTURE INDICATED

## 2020-11-14 NOTE — Progress Notes (Signed)
   Stephanie Santos Aug 16, 1991 [id number]   History:  30 y.o. G0 presents as new patient for gynecological exam and to discuss infertility. She has been with the same partner for 8 years without contraception. She is not currently tracking cycles using an app but says she is very regular and has ovulation symptoms each month. No history of pregnancy.  She is also having urinary frequency and urgency that is not new for her. Denies dysuria or vaginal symptoms. She gained 80 pounds in a years time after her mother died and has lost 35 pounds of this. Normal pap history. Pre-diabetes managed by PCP .  Gynecologic History Patient's last menstrual period was 10/26/2020.   Contraception/Family planning: none  Health Maintenance Last Pap: 2020. Results were: normal Last mammogram: N/A Last colonoscopy: N/A Last Dexa: N/A  Past medical history, past surgical history, family history and social history were all reviewed and documented in the EPIC chart.  ROS:  A ROS was performed and pertinent positives and negatives are included.  Exam:  Vitals:   11/14/20 0926  BP: 118/70  Weight: 244 lb (110.7 kg)  Height: 5\' 5"  (1.651 m)   Body mass index is 40.6 kg/m.  General appearance:  Normal Thyroid:  Symmetrical, normal in size, without palpable masses or nodularity. Respiratory  Auscultation:  Clear without wheezing or rhonchi Cardiovascular  Auscultation:  Regular rate, without rubs, murmurs or gallops  Edema/varicosities:  Not grossly evident Abdominal  Soft,nontender, without masses, guarding or rebound.  Liver/spleen:  No organomegaly noted  Hernia:  None appreciated  Skin  Inspection:  Grossly normal   Breasts: Examined lying and sitting.   Right: Without masses, retractions, discharge or axillary adenopathy.   Left: Without masses, retractions, discharge or axillary adenopathy. Gentitourinary   Inguinal/mons:  Normal without inguinal adenopathy  External genitalia:   Normal  BUS/Urethra/Skene's glands:  Normal  Vagina:  Normal  Cervix:  Normal  Uterus:  Normal in size, shape and contour.  Midline and mobile  Adnexa/parametria:     Rt: Without masses or tenderness.   Lt: Without masses or tenderness.  Anus and perineum: Normal  Assessment/Plan:  30 y.o. G0 for annual exam.   Well female exam with routine gynecological exam - Education provided on SBEs, importance of preventative screenings, current guidelines, high calcium diet, regular exercise, and multivitamin daily. Preventative labs with PCP.   Urgency of urination - Plan: Urinalysis,Complete w/RFL Culture. UA negative. We discussed pre-diabetes and the effect it can have on increased urination.   Primary female infertility - She will return for day 3 FSH. At that time we ill also check LH, estradiol, and AMH. She will also return for a 21-day progesterone. Recommended that her partner have sperm analysis completed. If blood work is normal we discussed ovulation induction for 3 months and if unsuccessful we will refer to fertility specialist. Also recommend downloading app for cycle and ovulation tracking and increasing frequency of intercourse around ovulation. Normal TSH history, with most recent in October.   Screen for STD (sexually transmitted disease) - Plan: SURES WAB CT/NG/T. vaginalis, RPR, HIV Antibody (routine testing w rflx)  Screening for cervical cancer - normal pap history. Will repeat at 3-year interval per guidelines.   Follow up in 1 year for annual. She will return for lab work at appropriate times.     [facility], 10:08 AM 11/14/2020

## 2020-11-14 NOTE — Patient Instructions (Signed)
Health Maintenance, Female Adopting a healthy lifestyle and getting preventive care are important in promoting health and wellness. Ask your health care provider about:  The right schedule for you to have regular tests and exams.  Things you can do on your own to prevent diseases and keep yourself healthy. What should I know about diet, weight, and exercise? Eat a healthy diet  Eat a diet that includes plenty of vegetables, fruits, low-fat dairy products, and lean protein.  Do not eat a lot of foods that are high in solid fats, added sugars, or sodium.   Maintain a healthy weight Body mass index (BMI) is used to identify weight problems. It estimates body fat based on height and weight. Your health care provider can help determine your BMI and help you achieve or maintain a healthy weight. Get regular exercise Get regular exercise. This is one of the most important things you can do for your health. Most adults should:  Exercise for at least 150 minutes each week. The exercise should increase your heart rate and make you sweat (moderate-intensity exercise).  Do strengthening exercises at least twice a week. This is in addition to the moderate-intensity exercise.  Spend less time sitting. Even light physical activity can be beneficial. Watch cholesterol and blood lipids Have your blood tested for lipids and cholesterol at 31 years of age, then have this test every 5 years. Have your cholesterol levels checked more often if:  Your lipid or cholesterol levels are high.  You are older than 31 years of age.  You are at high risk for heart disease. What should I know about cancer screening? Depending on your health history and family history, you may need to have cancer screening at various ages. This may include screening for:  Breast cancer.  Cervical cancer.  Colorectal cancer.  Skin cancer.  Lung cancer. What should I know about heart disease, diabetes, and high blood  pressure? Blood pressure and heart disease  High blood pressure causes heart disease and increases the risk of stroke. This is more likely to develop in people who have high blood pressure readings, are of African descent, or are overweight.  Have your blood pressure checked: ? Every 3-5 years if you are 18-39 years of age. ? Every year if you are 40 years old or older. Diabetes Have regular diabetes screenings. This checks your fasting blood sugar level. Have the screening done:  Once every three years after age 40 if you are at a normal weight and have a low risk for diabetes.  More often and at a younger age if you are overweight or have a high risk for diabetes. What should I know about preventing infection? Hepatitis B If you have a higher risk for hepatitis B, you should be screened for this virus. Talk with your health care provider to find out if you are at risk for hepatitis B infection. Hepatitis C Testing is recommended for:  Everyone born from 1945 through 1965.  Anyone with known risk factors for hepatitis C. Sexually transmitted infections (STIs)  Get screened for STIs, including gonorrhea and chlamydia, if: ? You are sexually active and are younger than 31 years of age. ? You are older than 31 years of age and your health care provider tells you that you are at risk for this type of infection. ? Your sexual activity has changed since you were last screened, and you are at increased risk for chlamydia or gonorrhea. Ask your health care provider   if you are at risk.  Ask your health care provider about whether you are at high risk for HIV. Your health care provider may recommend a prescription medicine to help prevent HIV infection. If you choose to take medicine to prevent HIV, you should first get tested for HIV. You should then be tested every 3 months for as long as you are taking the medicine. Pregnancy  If you are about to stop having your period (premenopausal) and  you may become pregnant, seek counseling before you get pregnant.  Take 400 to 800 micrograms (mcg) of folic acid every day if you become pregnant.  Ask for birth control (contraception) if you want to prevent pregnancy. Osteoporosis and menopause Osteoporosis is a disease in which the bones lose minerals and strength with aging. This can result in bone fractures. If you are 65 years old or older, or if you are at risk for osteoporosis and fractures, ask your health care provider if you should:  Be screened for bone loss.  Take a calcium or vitamin D supplement to lower your risk of fractures.  Be given hormone replacement therapy (HRT) to treat symptoms of menopause. Follow these instructions at home: Lifestyle  Do not use any products that contain nicotine or tobacco, such as cigarettes, e-cigarettes, and chewing tobacco. If you need help quitting, ask your health care provider.  Do not use street drugs.  Do not share needles.  Ask your health care provider for help if you need support or information about quitting drugs. Alcohol use  Do not drink alcohol if: ? Your health care provider tells you not to drink. ? You are pregnant, may be pregnant, or are planning to become pregnant.  If you drink alcohol: ? Limit how much you use to 0-1 drink a day. ? Limit intake if you are breastfeeding.  Be aware of how much alcohol is in your drink. In the [place] one drink equals one 12 oz bottle of beer (355 mL), one 5 oz glass of wine (148 mL), or one 1 oz glass of hard liquor (44 mL). General instructions  Schedule regular health, dental, and eye exams.  Stay current with your vaccines.  Tell your health care provider if: ? You often feel depressed. ? You have ever been abused or do not feel safe at home. Summary  Adopting a healthy lifestyle and getting preventive care are important in promoting health and wellness.  Follow your health care provider's instructions about healthy  diet, exercising, and getting tested or screened for diseases.  Follow your health care provider's instructions on monitoring your cholesterol and blood pressure. This information is not intended to replace advice given to you by your health care provider. Make sure you discuss any questions you have with your health care provider. Document Revised: 08/20/2018 Document Reviewed: 08/20/2018 Elsevier Patient Education  2021 Elsevier Inc.  

## 2020-11-15 LAB — HIV ANTIBODY (ROUTINE TESTING W REFLEX): HIV 1&2 Ab, 4th Generation: NONREACTIVE

## 2020-11-15 LAB — RPR: RPR Ser Ql: NONREACTIVE

## 2020-11-16 LAB — SURESWAB CT/NG/T. VAGINALIS
C. trachomatis RNA, TMA: NOT DETECTED
N. gonorrhoeae RNA, TMA: NOT DETECTED
Trichomonas vaginalis RNA: NOT DETECTED

## 2020-12-08 ENCOUNTER — Ambulatory Visit: Payer: Federal, State, Local not specified - PPO | Admitting: Family Medicine

## 2020-12-08 ENCOUNTER — Encounter: Payer: Self-pay | Admitting: Family Medicine

## 2020-12-08 ENCOUNTER — Other Ambulatory Visit: Payer: Self-pay

## 2020-12-08 DIAGNOSIS — D649 Anemia, unspecified: Secondary | ICD-10-CM

## 2020-12-08 DIAGNOSIS — L608 Other nail disorders: Secondary | ICD-10-CM

## 2020-12-08 DIAGNOSIS — E669 Obesity, unspecified: Secondary | ICD-10-CM

## 2020-12-08 DIAGNOSIS — Z8739 Personal history of other diseases of the musculoskeletal system and connective tissue: Secondary | ICD-10-CM

## 2020-12-08 DIAGNOSIS — R7989 Other specified abnormal findings of blood chemistry: Secondary | ICD-10-CM | POA: Diagnosis not present

## 2020-12-08 DIAGNOSIS — R7303 Prediabetes: Secondary | ICD-10-CM | POA: Diagnosis not present

## 2020-12-08 HISTORY — DX: Morbid (severe) obesity due to excess calories: E66.01

## 2020-12-08 NOTE — Progress Notes (Signed)
   Subjective:    Patient ID: Stephanie Santos, female    DOB: 11-04-1990, 30 y.o.   MRN: [id number]  HPI Chief Complaint  Patient presents with  . Follow-up    Follow-up on weight, having gout flare up on left foot   She is here to follow up on weight and abnormal labs including prediabetes, low serum calcium, protein and mild anemia.   She gained 85 lbs in a years time after her mother died and has lost approximately 35 labs of this.  States she has plateaued and is having trouble with any further weight loss.  Recently saw her OB/GYN and had a Pap smear as well as labs to check her hormone levels.  She has a new concern today regarding the toenail on her left great toe.  The nail has been abnormal for quite some time and has become quite painful.  States she goes to a nail salon and knows she should not go there.  She also reports a history of gout in her left foot.  Is not currently having a gout flare-up  No fever, chills, dizziness, chest pain, palpitations, shortness of breath, abdominal pain, nausea, vomiting or diarrhea.     Review of Systems Pertinent positives and negatives in the history of present illness.     Objective:   Physical Exam BP 118/70   Pulse 77   Ht 5\' 5"  (1.651 m)   Wt 245 lb 9.6 oz (111.4 kg)   BMI 40.87 kg/m   Alert and in no distress.  Left great toe nail with thickened and curved nail, no edema or erythema. Normal foot exam otherwise.        Assessment & Plan:  Morbid obesity (HCC) - Plan: Amb Ref to Medical Weight Management, CBC with Differential/Platelet, Comprehensive metabolic panel, Hemoglobin A1c -Counseling on cutting back on sugary beverages, carbohydrates, portion sizes and increasing physical activity.  Discussed short bursts of exercise for even 5 to 10 minutes 2-3 times per day would be beneficial.  I am referring her to [facility] health weight management for further assistance with weight loss.  She is happy about this.  Anemia,  unspecified type - Plan: Amb Ref to Medical Weight Management, CBC with Differential/Platelet, Comprehensive metabolic panel, Iron, TIBC and Ferritin Panel -She has been on a multivitamin since our last visit in October.  Reports eating a more well-balanced diet.  Check labs and follow-up  Low serum calcium - Plan: Amb Ref to Medical Weight Management -Healthier diet since October 2021.  Check labs and follow-up  History of gout - Plan: Uric acid -Does not currently have gout symptoms.  Check uric acid level and follow-up  Acquired deformity of nail - Plan: Ambulatory referral to Podiatry -currently no sign of infection. avoid pedicures until seen by podiatrist.   Prediabetes - Plan: Amb Ref to Medical Weight Management, Comprehensive metabolic panel, Hemoglobin A1c -She is aware that her hemoglobin A1c was 5.8% in October and that this is a precursor for developing diabetes.  Counseling on the diabetes spectrum and how to prevent this from worsening including healthy diet and exercise.  Check hemoglobin A1c and follow-up.

## 2020-12-09 LAB — COMPREHENSIVE METABOLIC PANEL
ALT: 10 IU/L (ref 0–32)
AST: 13 IU/L (ref 0–40)
Albumin/Globulin Ratio: 1.2 (ref 1.2–2.2)
Albumin: 3.7 g/dL — ABNORMAL LOW (ref 3.8–4.8)
Alkaline Phosphatase: 82 IU/L (ref 44–121)
BUN/Creatinine Ratio: 23 (ref 9–23)
BUN: 18 mg/dL (ref 6–20)
Bilirubin Total: 0.3 mg/dL (ref 0.0–1.2)
CO2: 20 mmol/L (ref 20–29)
Calcium: 8.7 mg/dL (ref 8.7–10.2)
Chloride: 102 mmol/L (ref 96–106)
Creatinine, Ser: 0.8 mg/dL (ref 0.57–1.00)
Globulin, Total: 3 g/dL (ref 1.5–4.5)
Glucose: 88 mg/dL (ref 65–99)
Potassium: 4.5 mmol/L (ref 3.5–5.2)
Sodium: 138 mmol/L (ref 134–144)
Total Protein: 6.7 g/dL (ref 6.0–8.5)
eGFR: 101 mL/min/{1.73_m2} (ref >59)

## 2020-12-09 LAB — CBC WITH DIFFERENTIAL/PLATELET
Basophils Absolute: 0 10*3/uL (ref 0.0–0.2)
Basos: 1 %
EOS (ABSOLUTE): 0.2 10*3/uL (ref 0.0–0.4)
Eos: 4 %
Hematocrit: 36.1 % (ref 34.0–46.6)
Hemoglobin: 10.9 g/dL — ABNORMAL LOW (ref 11.1–15.9)
Immature Grans (Abs): 0 10*3/uL (ref 0.0–0.1)
Immature Granulocytes: 0 %
Lymphocytes Absolute: 1.8 10*3/uL (ref 0.7–3.1)
Lymphs: 34 %
MCH: 20.9 pg — ABNORMAL LOW (ref 26.6–33.0)
MCHC: 30.2 g/dL — ABNORMAL LOW (ref 31.5–35.7)
MCV: 69 fL — ABNORMAL LOW (ref 79–97)
Monocytes Absolute: 0.5 10*3/uL (ref 0.1–0.9)
Monocytes: 9 %
Neutrophils Absolute: 2.8 10*3/uL (ref 1.4–7.0)
Neutrophils: 52 %
Platelets: 182 10*3/uL (ref 150–450)
RBC: 5.21 x10E6/uL (ref 3.77–5.28)
RDW: 16.5 % — ABNORMAL HIGH (ref 11.7–15.4)
WBC: 5.4 10*3/uL (ref 3.4–10.8)

## 2020-12-09 LAB — IRON,TIBC AND FERRITIN PANEL
Ferritin: 7 ng/mL — ABNORMAL LOW (ref 15–150)
Iron Saturation: 6 % — CL (ref 15–55)
Iron: 21 ug/dL — ABNORMAL LOW (ref 27–159)
Total Iron Binding Capacity: 368 ug/dL (ref 250–450)
UIBC: 347 ug/dL (ref 131–425)

## 2020-12-09 LAB — HEMOGLOBIN A1C
Est. average glucose Bld gHb Est-mCnc: 117 mg/dL
Hgb A1c MFr Bld: 5.7 % — ABNORMAL HIGH (ref 4.8–5.6)

## 2020-12-09 LAB — URIC ACID: Uric Acid: 5.2 mg/dL (ref 2.6–6.2)

## 2020-12-09 NOTE — Progress Notes (Signed)
Her iron is severely low but her hemoglobin (blood count) is ok, mildly anemic. I recommend that she take an over the counter iron supplement once daily. She can take a stool softener with this to prevent constipation as iron can cause this. Her Hgb A1c did improve and she is at 5.7% which is the very beginning of prediabetes. Continue to limit sugar and carbohydrates (potatoes, bread, rice, pasta) Her uric acid level is normal so hopefully she will not have any gout flare ups.

## 2020-12-13 ENCOUNTER — Encounter: Payer: Self-pay | Admitting: Podiatry

## 2020-12-13 ENCOUNTER — Ambulatory Visit: Payer: Federal, State, Local not specified - PPO | Admitting: Podiatry

## 2020-12-13 ENCOUNTER — Other Ambulatory Visit: Payer: Self-pay

## 2020-12-13 DIAGNOSIS — B351 Tinea unguium: Secondary | ICD-10-CM | POA: Diagnosis not present

## 2020-12-13 MED ORDER — FLUCONAZOLE 150 MG PO TABS: 150.0000 mg | ORAL_TABLET | ORAL | 1 refills | Status: DC

## 2020-12-13 NOTE — Patient Instructions (Signed)

## 2021-01-10 ENCOUNTER — Ambulatory Visit: Payer: Federal, State, Local not specified - PPO | Admitting: Podiatry

## 2021-01-10 ENCOUNTER — Other Ambulatory Visit: Payer: Self-pay

## 2021-01-10 DIAGNOSIS — L600 Ingrowing nail: Secondary | ICD-10-CM

## 2021-01-10 DIAGNOSIS — M79676 Pain in unspecified toe(s): Secondary | ICD-10-CM

## 2021-01-10 NOTE — Progress Notes (Signed)
  Subjective:  Patient ID: Stephanie Santos, female    DOB: 10-22-1990,  MRN: [id number]  Chief Complaint  Patient presents with  . Follow-up    Reports doing well since left hallux removal and abx Rx for infection for rt hallux as well. Denies any lingering signs of infection.     30 y.o. female presents for follow up of nail procedure. History confirmed with patient.   Objective:  Physical Exam: Ingrown nail avulsion site: overlying soft crust, no warmth, no drainage and no erythema Assessment:   1. Ingrown nail   2. Pain around toenail      Plan:  Patient was evaluated and treated and all questions answered.  S/p Ingrown Toenail Excision, left -Healing well without issue. -Discussed return precautions. -F/u PRN

## 2021-01-11 ENCOUNTER — Other Ambulatory Visit: Payer: Self-pay

## 2021-01-11 ENCOUNTER — Other Ambulatory Visit: Payer: Federal, State, Local not specified - PPO

## 2021-01-11 ENCOUNTER — Other Ambulatory Visit: Payer: Self-pay | Admitting: Gynecology

## 2021-01-11 DIAGNOSIS — N979 Female infertility, unspecified: Secondary | ICD-10-CM

## 2021-01-12 NOTE — Progress Notes (Signed)
  Subjective:  Patient ID: Stephanie Santos, female    DOB: 11/17/1990,  MRN: [id number]  Chief Complaint  Patient presents with  . Nail Problem    Possible nail fungus x months bilater great toe nails. Thick nails with discoloration.    30 y.o. female presents with the above complaint. History confirmed with patient.   Objective:  Physical Exam: warm, good capillary refill, no trophic changes or ulcerative lesions, normal DP and PT pulses and normal sensory exam. Nails thickened and dystrophic. Left Foot: normal exam, no swelling, tenderness, instability; ligaments intact, full range of motion of all ankle/foot joints  Right Foot: normal exam, no swelling, tenderness, instability; ligaments intact, full range of motion of all ankle/foot joints   Assessment:   1. Onychomycosis    Plan:  Patient was evaluated and treated and all questions answered.  Onychomycosis -Educated on etiology. -Nails courtesy debrided. -eRx fluconazole weekly.  Return in about 4 weeks (around 01/10/2021) for Nail Fungus.

## 2021-01-14 LAB — LUTEINIZING HORMONE: LH: 4.6 m[IU]/mL

## 2021-01-14 LAB — ESTRADIOL: Estradiol: 265 pg/mL

## 2021-01-14 LAB — PROGESTERONE: Progesterone: 18.5 ng/mL

## 2021-01-14 LAB — FOLLICLE STIMULATING HORMONE: FSH: 1.5 m[IU]/mL

## 2021-01-14 LAB — ANTI-MULLERIAN HORMONE (AMH), FEMALE: Anti-Mullerian Hormones(AMH), Female: 4.75 ng/mL (ref 0.36–10.07)

## 2021-01-21 DIAGNOSIS — S29012A Strain of muscle and tendon of back wall of thorax, initial encounter: Secondary | ICD-10-CM | POA: Diagnosis not present

## 2021-01-21 DIAGNOSIS — S29011A Strain of muscle and tendon of front wall of thorax, initial encounter: Secondary | ICD-10-CM | POA: Diagnosis not present

## 2021-03-14 DIAGNOSIS — U071 COVID-19: Secondary | ICD-10-CM | POA: Diagnosis not present

## 2021-05-31 ENCOUNTER — Ambulatory Visit: Payer: Federal, State, Local not specified - PPO | Admitting: Nurse Practitioner

## 2021-05-31 ENCOUNTER — Other Ambulatory Visit: Payer: Self-pay

## 2021-05-31 ENCOUNTER — Encounter: Payer: Self-pay | Admitting: Nurse Practitioner

## 2021-05-31 VITALS — BP 122/78

## 2021-05-31 DIAGNOSIS — Z113 Encounter for screening for infections with a predominantly sexual mode of transmission: Secondary | ICD-10-CM

## 2021-05-31 NOTE — Progress Notes (Signed)
   Acute Office Visit  Subjective:    Patient ID: Stephanie Santos, female    DOB: [birth year], 30 y.o.   MRN: [id number]   HPI 30 y.o. presents today for STD testing. Denies symptoms.    Review of Systems  Constitutional: Negative.   Genitourinary: Negative.       Objective:    Physical Exam Constitutional:      Appearance: Normal appearance.  Genitourinary:    General: Normal vulva.     Vagina: Normal.     Cervix: Normal.    BP 122/78 (Cuff Size: Large)   LMP 05/15/2021  Wt Readings from Last 3 Encounters:  12/08/20 245 lb 9.6 oz (111.4 kg)  11/14/20 244 lb (110.7 kg)  06/30/20 246 lb 6.4 oz (111.8 kg)        Assessment & Plan:   Problem List Items Addressed This Visit   None Visit Diagnoses     Screen for STD (sexually transmitted disease)    -  Primary   Relevant Orders   SURESWAB CT/NG/T. vaginalis   RPR   HIV Antibody (routine testing w rflx)      Plan: STD panel pending.     Olivia Mackie DNP, 9:05 AM 05/31/2021

## 2021-06-01 LAB — HIV ANTIBODY (ROUTINE TESTING W REFLEX): HIV 1&2 Ab, 4th Generation: NONREACTIVE

## 2021-06-01 LAB — RPR: RPR Ser Ql: NONREACTIVE

## 2021-06-01 LAB — SURESWAB CT/NG/T. VAGINALIS
C. trachomatis RNA, TMA: NOT DETECTED
N. gonorrhoeae RNA, TMA: NOT DETECTED
Trichomonas vaginalis RNA: NOT DETECTED

## 2021-11-08 NOTE — Progress Notes (Deleted)
? ?Established Patient Office Visit ? ?Subjective:  ?Patient ID: Stephanie Santos, female    DOB: 29-Mar-1991  Age: 31 y.o. MRN: [id number] ? ?CC: No chief complaint on file. ? ? ?HPI ?Stephanie Santos presents for *** ? ?Past Medical History:  ?Diagnosis Date  ? Anemia 07/01/2020  ? Asthma   ? Low serum calcium 07/01/2020  ? Prediabetes 07/01/2020  ? ? ?No past surgical history on file. ? ?Family History  ?Problem Relation Age of Onset  ? Pancreatic cancer Mother 44  ? Ovarian cancer Maternal Grandmother   ? Diabetes Maternal Grandmother   ? Cancer Maternal Grandmother   ?     OVARIAN   ? ? ?Social History  ? ?Socioeconomic History  ? Marital status: Single  ?  Spouse name: Not on file  ? Number of children: Not on file  ? Years of education: Not on file  ? Highest education level: Not on file  ?Occupational History  ? Not on file  ?Tobacco Use  ? Smoking status: Never  ? Smokeless tobacco: Never  ?Vaping Use  ? Vaping Use: Never used  ?Substance and Sexual Activity  ? Alcohol use: Not Currently  ?  Comment: RARE  ? Drug use: Never  ? Sexual activity: Yes  ?  Partners: Male  ?  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 Not on file  ?Social History Narrative  ? Not on file  ? ?Social Determinants of Health  ? ?Financial Resource Strain: Not on file  ?Food Insecurity: Not on file  ?Transportation Needs: Not on file  ?Physical Activity: Not on file  ?Stress: Not on file  ?Social Connections: Not on file  ?Intimate Partner Violence: Not on file  ? ? ?Outpatient Medications Prior to Visit  ?Medication Sig Dispense Refill  ? albuterol (VENTOLIN HFA) 108 (90 Base) MCG/ACT inhaler Inhale into the lungs.    ? ?No facility-administered medications prior to visit.  ? ? ?No Known Allergies ? ?ROS ?Review of Systems ? ?  ?Objective:  ?  ?Physical Exam ? ?There were no vitals taken for this visit. ?Wt Readings from Last 3 Encounters:  ?12/08/20 245 lb 9.6 oz (111.4 kg)  ?11/14/20 244 lb  (110.7 kg)  ?06/30/20 246 lb 6.4 oz (111.8 kg)  ? ? ? ?Health Maintenance Due  ?Topic Date Due  ? COVID-19 Vaccine (1) Never done  ? TETANUS/TDAP  Never done  ? Santos SMEAR-Modifier  Never done  ? INFLUENZA VACCINE  Never done  ? ? ?There are no preventive care reminders to display for this patient. ? ?Lab Results  ?Component Value Date  ? TSH 1.670 06/30/2020  ? ?Lab Results  ?Component Value Date  ? WBC 5.4 12/08/2020  ? HGB 10.9 (L) 12/08/2020  ? HCT 36.1 12/08/2020  ? MCV 69 (L) 12/08/2020  ? PLT 182 12/08/2020  ? ?Lab Results  ?Component Value Date  ? NA 138 12/08/2020  ? K 4.5 12/08/2020  ? CO2 20 12/08/2020  ? GLUCOSE 88 12/08/2020  ? BUN 18 12/08/2020  ? CREATININE 0.80 12/08/2020  ? BILITOT 0.3 12/08/2020  ? ALKPHOS 82 12/08/2020  ? AST 13 12/08/2020  ? ALT 10 12/08/2020  ? PROT 6.7 12/08/2020  ? ALBUMIN 3.7 (L) 12/08/2020  ? CALCIUM 8.7 12/08/2020  ? ANIONGAP 10 02/02/2020  ? EGFR 101 12/08/2020  ? ?Lab Results  ?Component Value Date  ? CHOL 160 06/30/2020  ? ?Lab Results  ?  Component Value Date  ? HDL 52 06/30/2020  ? ?Lab Results  ?Component Value Date  ? [place] 100 (H) 06/30/2020  ? ?Lab Results  ?Component Value Date  ? TRIG 32 06/30/2020  ? ?Lab Results  ?Component Value Date  ? CHOLHDL 3.1 06/30/2020  ? ?Lab Results  ?Component Value Date  ? HGBA1C 5.7 (H) 12/08/2020  ? ? ?  ?Assessment & Plan:  ? ?Problem List Items Addressed This Visit   ?None ? ? ?No orders of the defined types were placed in this encounter. ? ? ?Follow-up: No follow-ups on file.  ? ? ?Stephanie Pap, PA-C ?

## 2021-11-09 ENCOUNTER — Encounter: Payer: Federal, State, Local not specified - PPO | Admitting: Physician Assistant

## 2021-11-09 DIAGNOSIS — Z0000 Encounter for general adult medical examination without abnormal findings: Secondary | ICD-10-CM

## 2021-11-09 NOTE — Progress Notes (Deleted)
? ?Acute Office Visit ? ?Subjective:  ? ? Patient ID: Stephanie Santos, female    DOB: [birth year], 31 y.o.   MRN: [id number] ? ?No chief complaint on file. ? ? ?HPI ?Patient is in today for *** ? ?Past Medical History:  ?Diagnosis Date  ? Anemia 07/01/2020  ? Asthma   ? Low serum calcium 07/01/2020  ? Prediabetes 07/01/2020  ? ? ?No past surgical history on file. ? ?Family History  ?Problem Relation Age of Onset  ? Pancreatic cancer Mother 22  ? Ovarian cancer Maternal Grandmother   ? Diabetes Maternal Grandmother   ? Cancer Maternal Grandmother   ?     OVARIAN   ? ? ?Social History  ? ?Socioeconomic History  ? Marital status: Single  ?  Spouse name: Not on file  ? Number of children: Not on file  ? Years of education: Not on file  ? Highest education level: Not on file  ?Occupational History  ? Not on file  ?Tobacco Use  ? Smoking status: Never  ? Smokeless tobacco: Never  ?Vaping Use  ? Vaping Use: Never used  ?Substance and Sexual Activity  ? Alcohol use: Not Currently  ?  Comment: RARE  ? Drug use: Never  ? Sexual activity: Yes  ?  Partners: Male  ?  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 Not on file  ?Social History Narrative  ? Not on file  ? ?Social Determinants of Health  ? ?Financial Resource Strain: Not on file  ?Food Insecurity: Not on file  ?Transportation Needs: Not on file  ?Physical Activity: Not on file  ?Stress: Not on file  ?Social Connections: Not on file  ?Intimate Partner Violence: Not on file  ? ? ?Outpatient Medications Prior to Visit  ?Medication Sig Dispense Refill  ? albuterol (VENTOLIN HFA) 108 (90 Base) MCG/ACT inhaler Inhale into the lungs.    ? ?No facility-administered medications prior to visit.  ? ? ?No Known Allergies ? ?Review of Systems ? ?   ?Objective:  ?  ?Physical Exam ? ?There were no vitals taken for this visit. ?Wt Readings from Last 3 Encounters:  ?12/08/20 245 lb 9.6 oz (111.4 kg)  ?11/14/20 244 lb (110.7 kg)  ?06/30/20 246  lb 6.4 oz (111.8 kg)  ? ? ?Health Maintenance Due  ?Topic Date Due  ? COVID-19 Vaccine (1) Never done  ? TETANUS/TDAP  Never done  ? PAP SMEAR-Modifier  Never done  ? INFLUENZA VACCINE  Never done  ? ? ?There are no preventive care reminders to display for this patient. ? ? ?Lab Results  ?Component Value Date  ? TSH 1.670 06/30/2020  ? ?Lab Results  ?Component Value Date  ? WBC 5.4 12/08/2020  ? HGB 10.9 (L) 12/08/2020  ? HCT 36.1 12/08/2020  ? MCV 69 (L) 12/08/2020  ? PLT 182 12/08/2020  ? ?Lab Results  ?Component Value Date  ? NA 138 12/08/2020  ? K 4.5 12/08/2020  ? CO2 20 12/08/2020  ? GLUCOSE 88 12/08/2020  ? BUN 18 12/08/2020  ? CREATININE 0.80 12/08/2020  ? BILITOT 0.3 12/08/2020  ? ALKPHOS 82 12/08/2020  ? AST 13 12/08/2020  ? ALT 10 12/08/2020  ? PROT 6.7 12/08/2020  ? ALBUMIN 3.7 (L) 12/08/2020  ? CALCIUM 8.7 12/08/2020  ? ANIONGAP 10 02/02/2020  ? EGFR 101 12/08/2020  ? ?Lab Results  ?Component Value Date  ? CHOL 160 06/30/2020  ? ?Lab Results  ?  Component Value Date  ? HDL 52 06/30/2020  ? ?Lab Results  ?Component Value Date  ? [place] 100 (H) 06/30/2020  ? ?Lab Results  ?Component Value Date  ? TRIG 32 06/30/2020  ? ?Lab Results  ?Component Value Date  ? CHOLHDL 3.1 06/30/2020  ? ?Lab Results  ?Component Value Date  ? HGBA1C 5.7 (H) 12/08/2020  ? ? ?   ?Assessment & Plan:  ? ?Problem List Items Addressed This Visit   ?None ? ? ? ?No orders of the defined types were placed in this encounter. ? ? ? ?Irene Pap, PA-C ? ?

## 2021-11-10 ENCOUNTER — Ambulatory Visit: Payer: Federal, State, Local not specified - PPO | Admitting: Physician Assistant

## 2021-11-10 ENCOUNTER — Encounter: Payer: Self-pay | Admitting: Physician Assistant

## 2021-11-12 NOTE — Progress Notes (Deleted)
? ?Acute Office Visit ? ?Subjective:  ? ? Patient ID: Stephanie Santos, female    DOB: 12/01/1990, 31 y.o.   MRN: [id number] ? ?No chief complaint on file. ? ? ?HPI ?Patient is in today for *** ? ?Past Medical History:  ?Diagnosis Date  ? Anemia 07/01/2020  ? Asthma   ? Low serum calcium 07/01/2020  ? Prediabetes 07/01/2020  ? ? ?No past surgical history on file. ? ?Family History  ?Problem Relation Age of Onset  ? Pancreatic cancer Mother 50  ? Ovarian cancer Maternal Grandmother   ? Diabetes Maternal Grandmother   ? Cancer Maternal Grandmother   ?     OVARIAN   ? ? ?Social History  ? ?Socioeconomic History  ? Marital status: Single  ?  Spouse name: Not on file  ? Number of children: Not on file  ? Years of education: Not on file  ? Highest education level: Not on file  ?Occupational History  ? Not on file  ?Tobacco Use  ? Smoking status: Never  ? Smokeless tobacco: Never  ?Vaping Use  ? Vaping Use: Never used  ?Substance and Sexual Activity  ? Alcohol use: Not Currently  ?  Comment: RARE  ? Drug use: Never  ? Sexual activity: Yes  ?  Partners: Male  ?  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 Not on file  ?Social History Narrative  ? Not on file  ? ?Social Determinants of Health  ? ?Financial Resource Strain: Not on file  ?Food Insecurity: Not on file  ?Transportation Needs: Not on file  ?Physical Activity: Not on file  ?Stress: Not on file  ?Social Connections: Not on file  ?Intimate Partner Violence: Not on file  ? ? ?Outpatient Medications Prior to Visit  ?Medication Sig Dispense Refill  ? albuterol (VENTOLIN HFA) 108 (90 Base) MCG/ACT inhaler Inhale into the lungs.    ? ?No facility-administered medications prior to visit.  ? ? ?No Known Allergies ? ?Review of Systems ? ?   ?Objective:  ?  ?Physical Exam ? ?There were no vitals taken for this visit. ?Wt Readings from Last 3 Encounters:  ?12/08/20 245 lb 9.6 oz (111.4 kg)  ?11/14/20 244 lb (110.7 kg)  ?06/30/20 246  lb 6.4 oz (111.8 kg)  ? ? ?Health Maintenance Due  ?Topic Date Due  ? COVID-19 Vaccine (1) Never done  ? TETANUS/TDAP  Never done  ? PAP SMEAR-Modifier  Never done  ? INFLUENZA VACCINE  Never done  ? ? ?There are no preventive care reminders to display for this patient. ? ? ?Lab Results  ?Component Value Date  ? TSH 1.670 06/30/2020  ? ?Lab Results  ?Component Value Date  ? WBC 5.4 12/08/2020  ? HGB 10.9 (L) 12/08/2020  ? HCT 36.1 12/08/2020  ? MCV 69 (L) 12/08/2020  ? PLT 182 12/08/2020  ? ?Lab Results  ?Component Value Date  ? NA 138 12/08/2020  ? K 4.5 12/08/2020  ? CO2 20 12/08/2020  ? GLUCOSE 88 12/08/2020  ? BUN 18 12/08/2020  ? CREATININE 0.80 12/08/2020  ? BILITOT 0.3 12/08/2020  ? ALKPHOS 82 12/08/2020  ? AST 13 12/08/2020  ? ALT 10 12/08/2020  ? PROT 6.7 12/08/2020  ? ALBUMIN 3.7 (L) 12/08/2020  ? CALCIUM 8.7 12/08/2020  ? ANIONGAP 10 02/02/2020  ? EGFR 101 12/08/2020  ? ?Lab Results  ?Component Value Date  ? CHOL 160 06/30/2020  ? ?Lab Results  ?  Component Value Date  ? HDL 52 06/30/2020  ? ?Lab Results  ?Component Value Date  ? LDLCALC 100 (H) 06/30/2020  ? ?Lab Results  ?Component Value Date  ? TRIG 32 06/30/2020  ? ?Lab Results  ?Component Value Date  ? CHOLHDL 3.1 06/30/2020  ? ?Lab Results  ?Component Value Date  ? HGBA1C 5.7 (H) 12/08/2020  ? ? ?   ?Assessment & Plan:  ? ?Problem List Items Addressed This Visit   ?None ? ? ? ?No orders of the defined types were placed in this encounter. ? ? ? ?Reagen Haberman B Everest Brod, PA-C ? ?

## 2021-11-13 ENCOUNTER — Ambulatory Visit: Payer: Federal, State, Local not specified - PPO | Admitting: Physician Assistant

## 2021-11-20 ENCOUNTER — Encounter: Payer: Self-pay | Admitting: Nurse Practitioner

## 2021-11-20 ENCOUNTER — Other Ambulatory Visit: Payer: Self-pay

## 2021-11-20 ENCOUNTER — Ambulatory Visit: Payer: Federal, State, Local not specified - PPO | Admitting: Nurse Practitioner

## 2021-11-20 VITALS — BP 124/76

## 2021-11-20 DIAGNOSIS — Z113 Encounter for screening for infections with a predominantly sexual mode of transmission: Secondary | ICD-10-CM

## 2021-11-20 NOTE — Progress Notes (Signed)
? ?  Acute Office Visit ? ?Subjective:  ? ? Patient ID: Stephanie Santos, female    DOB: Dec 27, 1990, 31 y.o.   MRN: [id number] ? ? ?HPI ?31 y.o. presents today for STD screening. No symptoms or known exposure. Sexually active with one partner.  ? ? ?Review of Systems  ?Constitutional: Negative.   ?Genitourinary: Negative.   ? ?   ?Objective:  ?  ?Physical Exam ?Constitutional:   ?   Appearance: Normal appearance.  ?Genitourinary: ?   General: Normal vulva.  ?   Vagina: Normal.  ?   Cervix: Normal.  ? ? ?BP 124/76 (Cuff Size: Large)   LMP 10/31/2021  ?Wt Readings from Last 3 Encounters:  ?12/08/20 245 lb 9.6 oz (111.4 kg)  ?11/14/20 244 lb (110.7 kg)  ?06/30/20 246 lb 6.4 oz (111.8 kg)  ? ? ?   ? ?Patient informed chaperone available to be present for breast and pelvic exam. Patient has requested no chaperone to be present. Patient has been advised what will be completed during breast and pelvic exam.  ? ?Assessment & Plan:  ? ?Problem List Items Addressed This Visit   ?None ?Visit Diagnoses   ? ? Screen for STD (sexually transmitted disease)    -  Primary  ? Relevant Orders  ? SURESWAB CT/NG/T. vaginalis  ? RPR  ? HIV Antibody (routine testing w rflx)  ? ?  ? ?Plan: STD panel pending.  ? ? ? ? ?Tamela Gammon DNP, 11:33 AM 11/20/2021 ? ?

## 2021-11-21 LAB — SURESWAB CT/NG/T. VAGINALIS
C. trachomatis RNA, TMA: NOT DETECTED
N. gonorrhoeae RNA, TMA: NOT DETECTED
Trichomonas vaginalis RNA: NOT DETECTED

## 2021-11-21 LAB — RPR: RPR Ser Ql: NONREACTIVE

## 2021-11-21 LAB — HIV ANTIBODY (ROUTINE TESTING W REFLEX): HIV 1&2 Ab, 4th Generation: NONREACTIVE

## 2021-11-23 ENCOUNTER — Other Ambulatory Visit: Payer: Self-pay

## 2021-11-23 ENCOUNTER — Ambulatory Visit (INDEPENDENT_AMBULATORY_CARE_PROVIDER_SITE_OTHER): Payer: Federal, State, Local not specified - PPO | Admitting: Physician Assistant

## 2021-11-23 ENCOUNTER — Encounter: Payer: Self-pay | Admitting: Physician Assistant

## 2021-11-23 VITALS — BP 120/80 | HR 55 | Ht 66.0 in | Wt 257.2 lb

## 2021-11-23 DIAGNOSIS — Z0000 Encounter for general adult medical examination without abnormal findings: Secondary | ICD-10-CM

## 2021-11-23 DIAGNOSIS — D508 Other iron deficiency anemias: Secondary | ICD-10-CM | POA: Diagnosis not present

## 2021-11-23 DIAGNOSIS — J4520 Mild intermittent asthma, uncomplicated: Secondary | ICD-10-CM

## 2021-11-23 DIAGNOSIS — R7303 Prediabetes: Secondary | ICD-10-CM | POA: Diagnosis not present

## 2021-11-23 MED ORDER — ALBUTEROL SULFATE HFA 108 (90 BASE) MCG/ACT IN AERS: 2.0000 | INHALATION_SPRAY | Freq: Four times a day (QID) | RESPIRATORY_TRACT | 1 refills | Status: DC | PRN

## 2021-11-23 MED ORDER — PHENTERMINE HCL 15 MG PO CAPS: 15.0000 mg | ORAL_CAPSULE | ORAL | 0 refills | Status: DC

## 2021-11-23 NOTE — Assessment & Plan Note (Addendum)
Will start phentermine 15 mg, return in 4 weeks for follow up; patient advised that the maximum length of use will be 3 months/12 weeks; will continue a healthy eating regimen and add exercise ?

## 2021-11-23 NOTE — Assessment & Plan Note (Signed)
Stable, has not been tolerating iron supplements due to constipation, will check CBC today ?

## 2021-11-23 NOTE — Assessment & Plan Note (Signed)
Stable, infrequently has to use albuterol inhaler, will refill to use as needed ?

## 2021-11-23 NOTE — Assessment & Plan Note (Signed)
controlled, eat a low sugar diet, avoid starchy food with a lot of carbohydrates, avoid fried and processed foods, Hgb A1c lab checked today ? ?

## 2021-11-23 NOTE — Patient Instructions (Signed)
I will send you a MyChart message about your lab results. ? ?Preventative Care for Adults - Female ?   ?  MAINTAIN REGULAR HEALTH EXAMS: ?A routine yearly physical is a good way to check in with your primary care provider about your health and preventive screening. It is also an opportunity to share updates about your health and any concerns you have, and receive a thorough all-over exam.  ?Most health insurance companies pay for at least some preventative services.  Check with your health plan for specific coverages. ? ?WHAT PREVENTATIVE SERVICES DO WOMEN NEED? ?Adult women should have their weight and blood pressure checked regularly.  ?Women age 20 and older should have their cholesterol levels checked regularly. ?Women should be screened for cervical cancer with a Pap smear and pelvic exam beginning at either age 51, or 3 years after they become sexually activity.   ?Breast cancer screening generally begins at age 68 with a mammogram and breast exam by your primary care provider.   ?Beginning at age 58 and continuing to age 51, women should be screened for colorectal cancer.  Certain people may need continued testing until age 5. ?Updating vaccinations is part of preventative care.  Vaccinations help protect against diseases such as the flu. ?Osteoporosis is a disease in which the bones lose minerals and strength as we age. Women ages 81 and over should discuss this with their caregivers, as should women after menopause who have other risk factors. ?Lab tests are generally done as part of preventative care to screen for anemia and blood disorders, to screen for problems with the kidneys and liver, to screen for bladder problems, to check blood sugar, and to check your cholesterol level. ?Preventative services generally include counseling about diet, exercise, avoiding tobacco, drugs, excessive alcohol consumption, and sexually transmitted infections.   ? ?GENERAL RECOMMENDATIONS FOR GOOD HEALTH: ? ?Healthy  diet: ?Eat a variety of foods, including fruit, vegetables, animal or vegetable protein, such as meat, fish, chicken, and eggs, or beans, lentils, tofu, and grains, such as rice. ?Drink plenty of water daily (60 - 80 ounces or 8 - 10 glasses a day) ?Decrease saturated fat in the diet, avoid lots of red meat, processed foods, sweets, fast foods, and fried foods. For high cholesterol - Increase fiber intake (Benefiber or Metamucil, Cherrios,  oatmeal, beans, nuts, fruits and vegetables), limit saturated fats (in fried foods, red meat), can add OTC fish oil supplement, eat fish with Omega-3 fatty acids like salmon and tuna, exercise for 30 minutes 3 - 5 times a week, drink 8 - 10 glasses of water a day ? ?Exercise: ?Aerobic exercise helps maintain good heart health. At least 30-40 minutes of moderate-intensity exercise is recommended. For example, a brisk walk that increases your heart rate and breathing. This should be done on most days of the week.  ?Find a type of exercise or a variety of exercises that you enjoy so that it becomes a part of your daily life.  Examples are running, walking, swimming, water aerobics, and biking.  For motivation and support, explore group exercise such as aerobic class, spin class, Zumba, Yoga,or  martial arts, etc.   ?Set exercise goals for yourself, such as a certain weight goal, walk or run in a race such as a 5k walk/run.  Speak to your primary care provider about exercise goals. ? ?Disease prevention: ?If you smoke or chew tobacco, find out from your caregiver how to quit. It can literally save your life, no matter  how long you have been a tobacco user. If you do not use tobacco, never begin.  ?Maintain a healthy diet and normal weight. Increased weight leads to problems with blood pressure and diabetes.  ?The Body Mass Index or BMI is a way of measuring how much of your body is fat. Having a BMI above 27 increases the risk of heart disease, diabetes, hypertension, stroke and  other problems related to obesity. Your caregiver can help determine your BMI and based on it develop an exercise and dietary program to help you achieve or maintain this important measurement at a healthful level. ?High blood pressure causes heart and blood vessel problems.  Persistent high blood pressure should be treated with medicine if weight loss and exercise do not work.  ?Fat and cholesterol leaves deposits in your arteries that can block them. This causes heart disease and vessel disease elsewhere in your body.  If your cholesterol is found to be high, or if you have heart disease or certain other medical conditions, then you may need to have your cholesterol monitored frequently and be treated with medication.  ?Ask if you should have a cardiac stress test if your history suggests this. A stress test is a test done on a treadmill that looks for heart disease. This test can find disease prior to there being a problem. ?Menopause can be associated with physical symptoms and risks. Hormone replacement therapy is available to decrease these. You should talk to your caregiver about whether starting or continuing to take hormones is right for you.  ?Osteoporosis is a disease in which the bones lose minerals and strength as we age. This can result in serious bone fractures. Risk of osteoporosis can be identified using a bone density scan. Women ages 30 and over should discuss this with their caregivers, as should women after menopause who have other risk factors. Ask your caregiver whether you should be taking a calcium supplement and Vitamin D, to reduce the rate of osteoporosis.  ?Avoid drinking alcohol in excess (more than two drinks per day).  Avoid use of street drugs. Do not share needles with anyone. Ask for professional help if you need assistance or instructions on stopping the use of alcohol, cigarettes, and/or drugs. ?Brush your teeth twice a day with fluoride toothpaste, and floss once a day. Good  oral hygiene prevents tooth decay and gum disease. The problems can be painful, unattractive, and can cause other health problems. Visit your dentist for a routine oral and dental check up and preventive care every 6-12 months.  ?Look at your skin regularly.  Use a mirror to look at your back. Notify your caregivers of changes in moles, especially if there are changes in shapes, colors, a size larger than a pencil eraser, an irregular border, or development of new moles. ? ?Safety: ?Use seatbelts 100% of the time, whether driving or as a passenger.  Use safety devices such as hearing protection if you work in environments with loud noise or significant background noise.  Use safety glasses when doing any work that could send debris in to the eyes.  Use a helmet if you ride a bike or motorcycle.  Use appropriate safety gear for contact sports.  Talk to your caregiver about gun safety. ?Use sunscreen with a SPF (or skin protection factor) of 15 or greater.  Lighter skinned people are at a greater risk of skin cancer. Don?t forget to also wear sunglasses in order to protect your eyes from too much damaging  sunlight. Damaging sunlight can accelerate cataract formation.  ?Practice safe sex. Use condoms. Condoms are used for birth control and to help reduce the spread of sexually transmitted infections (or STIs).  Some of the STIs are gonorrhea (the clap), chlamydia, syphilis, trichomonas, herpes, HPV (human papilloma virus) and HIV (human immunodeficiency virus) which causes AIDS. The herpes, HIV and HPV are viral illnesses that have no cure. These can result in disability, cancer and death.  ?Keep carbon monoxide and smoke detectors in your home functioning at all times. Change the batteries every 6 months or use a model that plugs into the wall.  ? ?Vaccinations: ?Stay up to date with your tetanus shots and other required immunizations. You should have a booster for tetanus every 10 years. Be sure to get your flu shot  every year, since 5%-20% of the [place] population comes down with the flu. The flu vaccine changes each year, so being vaccinated once is not enough. Get your shot in the fall, before the flu season peaks.   ?[place]

## 2021-11-23 NOTE — Progress Notes (Addendum)
? ?Complete physical exam ? ? ?Patient: Stephanie Santos   DOB: June 14, 1991   31 y.o. Female  MRN: [id number] ?Visit Date: 11/23/2021 ? ?Chief Complaint  ?Patient presents with  ? Annual Exam  ?  Fasting CPE. Weight management. Pt has an appt for a pap on 4/13 with Tiffany Wallace-NP  ? ?Subjective  ?  ?Stephanie Santos is a 31 y.o. female who presents today for a complete physical exam.  ? ?Reports she is generally feeling well; is eating no fast foods, no processed foods; is trying to eat from 12 noon until 8 pm and not eat after that; is eating 2 meals a day and drinking 5 - 6 bottles of water a day; is sleeping well, about 7 - 8 hours at night; is not exercising, but walks her dog twice a day; plans to start exercising more. ? ?HPI ?HPI   ? ? Annual Exam   ? Additional comments: Fasting CPE. Weight management. Pt has an appt for a pap on 4/13 with Tiffany Wallace-NP ? ?  ?  ?Last edited by Deforest Hoyles, [place] on 11/23/2021  3:10 PM.  ?  ?  ? ? ?Past Medical History:  ?Diagnosis Date  ? Anemia 07/01/2020  ? Asthma   ? Low serum calcium 07/01/2020  ? Prediabetes 07/01/2020  ? ?History reviewed. No pertinent surgical history. ?Social History  ? ?Socioeconomic History  ? Marital status: Single  ?  Spouse name: Not on file  ? Number of children: Not on file  ? Years of education: Not on file  ? Highest education level: Not on file  ?Occupational History  ? Not on file  ?Tobacco Use  ? Smoking status: Never  ? Smokeless tobacco: Never  ?Vaping Use  ? Vaping Use: Never used  ?Substance and Sexual Activity  ? Alcohol use: Not Currently  ?  Comment: RARE  ? Drug use: Never  ? Sexual activity: Yes  ?  Partners: Male  ?  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 Not on file  ?Social History Narrative  ? Not on file  ? ?Social Determinants of Health  ? ?Financial Resource Strain: Not on file  ?Food Insecurity: Not on file  ?Transportation Needs: Not on file  ?Physical Activity: Not on  file  ?Stress: Not on file  ?Social Connections: Not on file  ?Intimate Partner Violence: Not on file  ? ?Family Status  ?Relation Name Status  ? Mother  Deceased  ? Father  Deceased  ? Sister  Alive  ? Brother  Alive  ? MGM  Alive  ? Sister  Alive  ? Sister  Alive  ? Sister  Alive  ? ?Family History  ?Problem Relation Age of Onset  ? Pancreatic cancer Mother 6  ? Ovarian cancer Maternal Grandmother   ? Diabetes Maternal Grandmother   ? Cancer Maternal Grandmother   ?     OVARIAN   ? ?No Known Allergies  ?Patient Care Team: ?Marcellina Millin as PCP - General (Physician Assistant)  ? ?Medications: ?Outpatient Medications Prior to Visit  ?Medication Sig Note  ? [DISCONTINUED] albuterol (VENTOLIN HFA) 108 (90 Base) MCG/ACT inhaler Inhale into the lungs. 11/23/2021: Pt is taking but needs a new prescription  ? ?No facility-administered medications prior to visit.  ? ? ?Review of Systems  ?Constitutional:  Negative for activity change, appetite change, fever and unexpected weight change.  ?HENT:  Negative for congestion, ear  pain and voice change.   ?Eyes:  Negative for redness.  ?Respiratory:  Negative for cough.   ?Cardiovascular:  Negative for chest pain.  ?Gastrointestinal:  Negative for constipation and diarrhea.  ?Endocrine: Negative for polyuria.  ?Genitourinary:  Negative for flank pain.  ?Musculoskeletal:  Negative for gait problem and neck stiffness.  ?Skin:  Negative for color change and rash.  ?Neurological:  Negative for dizziness.  ?Hematological:  Negative for adenopathy.  ?Psychiatric/Behavioral:  Negative for agitation, behavioral problems and confusion.   ? ?Last CBC ?Lab Results  ?Component Value Date  ? WBC 5.4 12/08/2020  ? HGB 10.9 (L) 12/08/2020  ? HCT 36.1 12/08/2020  ? MCV 69 (L) 12/08/2020  ? MCH 20.9 (L) 12/08/2020  ? RDW 16.5 (H) 12/08/2020  ? PLT 182 12/08/2020  ? ?Last metabolic panel ?Lab Results  ?Component Value Date  ? GLUCOSE 88 12/08/2020  ? NA 138 12/08/2020  ? K 4.5 12/08/2020   ? CL 102 12/08/2020  ? CO2 20 12/08/2020  ? BUN 18 12/08/2020  ? CREATININE 0.80 12/08/2020  ? EGFR 101 12/08/2020  ? CALCIUM 8.7 12/08/2020  ? PROT 6.7 12/08/2020  ? ALBUMIN 3.7 (L) 12/08/2020  ? LABGLOB 3.0 12/08/2020  ? AGRATIO 1.2 12/08/2020  ? BILITOT 0.3 12/08/2020  ? ALKPHOS 82 12/08/2020  ? AST 13 12/08/2020  ? ALT 10 12/08/2020  ? ANIONGAP 10 02/02/2020  ? ?Last lipids ?Lab Results  ?Component Value Date  ? CHOL 160 06/30/2020  ? HDL 52 06/30/2020  ? [place] 100 (H) 06/30/2020  ? TRIG 32 06/30/2020  ? CHOLHDL 3.1 06/30/2020  ? ?Last hemoglobin A1c ?Lab Results  ?Component Value Date  ? HGBA1C 5.7 (H) 12/08/2020  ? ?  ? ?The ASCVD Risk score (Arnett DK, et al., 2019) failed to calculate for the following reasons: ?  The 2019 ASCVD risk score is only valid for ages 66 to 35 ? ? Objective  ?  ?BP 120/80   Pulse (!) 55   Ht '5\' 6"'  (1.676 m)   Wt 257 lb 3.2 oz (116.7 kg)   LMP 10/31/2021   SpO2 100%   BMI 41.51 kg/m?  ? ? ? ? ?Physical Exam ?Vitals and nursing note reviewed.  ?Constitutional:   ?   General: She is not in acute distress. ?   Appearance: Normal appearance. She is not ill-appearing.  ?HENT:  ?   Head: Normocephalic and atraumatic.  ?   Right Ear: External ear normal.  ?   Left Ear: External ear normal.  ?   Nose: No congestion.  ?Eyes:  ?   Extraocular Movements: Extraocular movements intact.  ?   Conjunctiva/sclera: Conjunctivae normal.  ?   Pupils: Pupils are equal, round, and reactive to light.  ?Cardiovascular:  ?   Rate and Rhythm: Normal rate and regular rhythm.  ?   Pulses: Normal pulses.  ?   Heart sounds: Normal heart sounds.  ?Pulmonary:  ?   Effort: Pulmonary effort is normal.  ?   Breath sounds: Normal breath sounds. No wheezing.  ?Abdominal:  ?   General: Bowel sounds are normal.  ?   Palpations: Abdomen is soft.  ?Musculoskeletal:  ?   Cervical back: Normal range of motion and neck supple.  ?   Right lower leg: No edema.  ?   Left lower leg: No edema.  ?Skin: ?   General: Skin  is warm and dry.  ?   Findings: No bruising.  ?Neurological:  ?  General: No focal deficit present.  ?   Mental Status: She is alert and oriented to person, place, and time.  ?Psychiatric:     ?   Mood and Affect: Mood normal.     ?   Behavior: Behavior normal.     ?   Thought Content: Thought content normal.  ?  ? ? ?Last depression screening scores ?PHQ 2/9 Scores 11/23/2021 06/30/2020  ?PHQ - 2 Score 0 0  ? ?Last fall risk screening ?Fall Risk  11/23/2021  ?Falls in the past year? 0  ?Number falls in past yr: 0  ?Injury with Fall? 0  ?Risk for fall due to : No Fall Risks  ?Follow up Falls evaluation completed  ? ? ? ?No results found for any visits on 11/23/21. ? Assessment & Plan  ?  ?Routine Health Maintenance and Physical Exam ? ?Exercise Activities and Dietary recommendations ? Goals   ?None ?  ? ? ?Immunization History  ?Administered Date(s) Administered  ? Tdap 01/25/2015  ? ? ?Health Maintenance  ?Topic Date Due  ? PAP SMEAR-Modifier  Never done  ? TETANUS/TDAP  01/24/2025  ? Hepatitis C Screening  Completed  ? HIV Screening  Completed  ? HPV VACCINES  Aged Out  ? INFLUENZA VACCINE  Discontinued  ? COVID-19 Vaccine  Discontinued  ? ? ?Discussed health benefits of physical activity, and encouraged her to engage in regular exercise appropriate for her age and condition. ? ?Problem List Items Addressed This Visit   ? ?  ? Respiratory  ? Asthma  ?  Stable, infrequently has to use albuterol inhaler, will refill to use as needed ?  ?  ? Relevant Medications  ? albuterol (VENTOLIN HFA) 108 (90 Base) MCG/ACT inhaler  ?  ? Other  ? Prediabetes  ?   controlled, eat a low sugar diet, avoid starchy food with a lot of carbohydrates, avoid fried and processed foods, Hgb A1c lab checked today ? ?  ?  ? Relevant Orders  ? Comprehensive metabolic panel  ? Hemoglobin A1c  ? Anemia  ?  Stable, has not been tolerating iron supplements due to constipation, will check CBC today ?  ?  ? Relevant Orders  ? CBC with  Differential/Platelet  ? Severe obesity (BMI >= 40) (HCC)  ?  Will start phentermine 15 mg, return in 4 weeks for follow up; patient advised that the maximum length of use will be 3 months/12 weeks ?  ?  ? Relevant Medic

## 2021-11-24 LAB — CBC WITH DIFFERENTIAL/PLATELET
Basophils Absolute: 0 10*3/uL (ref 0.0–0.2)
Basos: 1 %
EOS (ABSOLUTE): 0.1 10*3/uL (ref 0.0–0.4)
Eos: 2 %
Hematocrit: 38.5 % (ref 34.0–46.6)
Hemoglobin: 11.5 g/dL (ref 11.1–15.9)
Immature Grans (Abs): 0 10*3/uL (ref 0.0–0.1)
Immature Granulocytes: 0 %
Lymphocytes Absolute: 1.8 10*3/uL (ref 0.7–3.1)
Lymphs: 33 %
MCH: 19.5 pg — ABNORMAL LOW (ref 26.6–33.0)
MCHC: 29.9 g/dL — ABNORMAL LOW (ref 31.5–35.7)
MCV: 65 fL — ABNORMAL LOW (ref 79–97)
Monocytes Absolute: 0.4 10*3/uL (ref 0.1–0.9)
Monocytes: 8 %
Neutrophils Absolute: 3.1 10*3/uL (ref 1.4–7.0)
Neutrophils: 56 %
Platelets: 206 10*3/uL (ref 150–450)
RBC: 5.9 x10E6/uL — ABNORMAL HIGH (ref 3.77–5.28)
RDW: 19.4 % — ABNORMAL HIGH (ref 11.7–15.4)
WBC: 5.5 10*3/uL (ref 3.4–10.8)

## 2021-11-24 LAB — LIPID PANEL
Chol/HDL Ratio: 3 ratio (ref 0.0–4.4)
Cholesterol, Total: 189 mg/dL (ref 100–199)
HDL: 63 mg/dL (ref >39)
LDL Chol Calc (NIH): 117 mg/dL — ABNORMAL HIGH (ref 0–99)
Triglycerides: 48 mg/dL (ref 0–149)
VLDL Cholesterol Cal: 9 mg/dL (ref 5–40)

## 2021-11-24 LAB — COMPREHENSIVE METABOLIC PANEL
ALT: 8 IU/L (ref 0–32)
AST: 15 IU/L (ref 0–40)
Albumin/Globulin Ratio: 1.2 (ref 1.2–2.2)
Albumin: 4 g/dL (ref 3.8–4.8)
Alkaline Phosphatase: 82 IU/L (ref 44–121)
BUN/Creatinine Ratio: 15 (ref 9–23)
BUN: 11 mg/dL (ref 6–20)
Bilirubin Total: 0.3 mg/dL (ref 0.0–1.2)
CO2: 23 mmol/L (ref 20–29)
Calcium: 9 mg/dL (ref 8.7–10.2)
Chloride: 104 mmol/L (ref 96–106)
Creatinine, Ser: 0.71 mg/dL (ref 0.57–1.00)
Globulin, Total: 3.3 g/dL (ref 1.5–4.5)
Glucose: 76 mg/dL (ref 70–99)
Potassium: 4.5 mmol/L (ref 3.5–5.2)
Sodium: 140 mmol/L (ref 134–144)
Total Protein: 7.3 g/dL (ref 6.0–8.5)
eGFR: 116 mL/min/{1.73_m2} (ref >59)

## 2021-11-24 LAB — HEMOGLOBIN A1C
Est. average glucose Bld gHb Est-mCnc: 117 mg/dL
Hgb A1c MFr Bld: 5.7 % — ABNORMAL HIGH (ref 4.8–5.6)

## 2021-11-27 ENCOUNTER — Encounter: Payer: Self-pay | Admitting: Physician Assistant

## 2021-12-02 ENCOUNTER — Telehealth: Payer: Self-pay

## 2021-12-02 NOTE — Telephone Encounter (Signed)
P.A. PHENTERMINE  

## 2021-12-03 NOTE — Telephone Encounter (Signed)
P.A. approved til 05/23/22, sent pt mychart message

## 2021-12-21 ENCOUNTER — Ambulatory Visit: Payer: Federal, State, Local not specified - PPO | Admitting: Nurse Practitioner

## 2021-12-25 NOTE — Progress Notes (Signed)
? ?Established Patient Office Visit ? ?Subjective:  ?Patient ID: Stephanie Santos, female    DOB: [birth year]  Age: 31 y.o. MRN: [id number] ? ?CC:  ?Chief Complaint  ?Patient presents with  ? Follow-up  ?  4 week follow up. No other concerns  ? ? ?HPI ?Stephanie Santos presents for weight management follow up: ?Is eating from 12 noon to 8 pm only; avoids fried food and fast foods; has a green smoothie for breakfast and veggies with lunch; is also eating fruits like banana and pineapples; drinks 6 bottles of water a day; is exercising using classes on YouTube doing kettle bells 3 times a week; reports that she is sleeping well and doesn't have heart palpitations. ? ?Outpatient Medications Prior to Visit  ?Medication Sig Dispense Refill  ? albuterol (VENTOLIN HFA) 108 (90 Base) MCG/ACT inhaler Inhale 2 puffs into the lungs every 6 (six) hours as needed for wheezing or shortness of breath. 18 g 1  ? phentermine 15 MG capsule Take 1 capsule (15 mg total) by mouth every morning. 30 capsule 0  ? ?No facility-administered medications prior to visit.  ? ? ?No Known Allergies ? ?ROS ?Review of Systems  ?Constitutional:  Negative for activity change and chills.  ?HENT:  Negative for congestion and voice change.   ?Eyes:  Negative for pain and redness.  ?Respiratory:  Negative for cough and wheezing.   ?Cardiovascular:  Negative for chest pain.  ?Gastrointestinal:  Negative for constipation, diarrhea, nausea and vomiting.  ?Endocrine: Negative for polyuria.  ?Genitourinary:  Negative for frequency.  ?Skin:  Negative for color change and rash.  ?Allergic/Immunologic: Negative for immunocompromised state.  ?Neurological:  Negative for dizziness.  ?Psychiatric/Behavioral:  Negative for agitation.   ? ?  ?Objective:  ?  ?Physical Exam ?Vitals and nursing note reviewed.  ?Constitutional:   ?   General: She is not in acute distress. ?   Appearance: She is normal weight. She is not ill-appearing.  ?HENT:  ?   Head: Normocephalic and  atraumatic.  ?   Right Ear: External ear normal.  ?   Left Ear: External ear normal.  ?Eyes:  ?   Extraocular Movements: Extraocular movements intact.  ?   Conjunctiva/sclera: Conjunctivae normal.  ?   Pupils: Pupils are equal, round, and reactive to light.  ?Cardiovascular:  ?   Rate and Rhythm: Normal rate and regular rhythm.  ?Pulmonary:  ?   Effort: Pulmonary effort is normal.  ?   Breath sounds: Normal breath sounds. No wheezing.  ?Abdominal:  ?   General: Bowel sounds are normal.  ?   Palpations: Abdomen is soft.  ?Musculoskeletal:     ?   General: Normal range of motion.  ?   Cervical back: Normal range of motion.  ?Skin: ?   General: Skin is warm and dry.  ?Neurological:  ?   Mental Status: She is alert and oriented to person, place, and time.  ?Psychiatric:     ?   Mood and Affect: Mood normal.  ? ? ?BP 128/78   Pulse 93   Ht '5\' 6"'  (1.676 m)   Wt 251 lb 3.2 oz (113.9 kg)   LMP 12/21/2021   SpO2 100%   BMI 40.54 kg/m?  ? ?Wt Readings from Last 3 Encounters:  ?12/26/21 251 lb 3.2 oz (113.9 kg)  ?11/23/21 257 lb 3.2 oz (116.7 kg)  ?12/08/20 245 lb 9.6 oz (111.4 kg)  ? ?BP Readings from Last 8 Encounters:  ?12/26/21 128/78  ?11/23/21  120/80  ?11/20/21 124/76  ?05/31/21 122/78  ?12/08/20 118/70  ?11/14/20 118/70  ?06/30/20 120/70  ?03/31/20 122/69  ? ? ? ?Results for orders placed or performed in visit on 11/23/21  ?CBC with Differential/Platelet  ?Result Value Ref Range  ? WBC 5.5 3.4 - 10.8 x10E3/uL  ? RBC 5.90 (H) 3.77 - 5.28 x10E6/uL  ? Hemoglobin 11.5 11.1 - 15.9 g/dL  ? Hematocrit 38.5 34.0 - 46.6 %  ? MCV 65 (L) 79 - 97 fL  ? MCH 19.5 (L) 26.6 - 33.0 pg  ? MCHC 29.9 (L) 31.5 - 35.7 g/dL  ? RDW 19.4 (H) 11.7 - 15.4 %  ? Platelets 206 150 - 450 x10E3/uL  ? Neutrophils 56 Not Estab. %  ? Lymphs 33 Not Estab. %  ? Monocytes 8 Not Estab. %  ? Eos 2 Not Estab. %  ? Basos 1 Not Estab. %  ? Neutrophils Absolute 3.1 1.4 - 7.0 x10E3/uL  ? Lymphocytes Absolute 1.8 0.7 - 3.1 x10E3/uL  ? Monocytes Absolute 0.4  0.1 - 0.9 x10E3/uL  ? EOS (ABSOLUTE) 0.1 0.0 - 0.4 x10E3/uL  ? Basophils Absolute 0.0 0.0 - 0.2 x10E3/uL  ? Immature Granulocytes 0 Not Estab. %  ? Immature Grans (Abs) 0.0 0.0 - 0.1 x10E3/uL  ?Comprehensive metabolic panel  ?Result Value Ref Range  ? Glucose 76 70 - 99 mg/dL  ? BUN 11 6 - 20 mg/dL  ? Creatinine, Ser 0.71 0.57 - 1.00 mg/dL  ? eGFR 116 >59 mL/min/1.73  ? BUN/Creatinine Ratio 15 9 - 23  ? Sodium 140 134 - 144 mmol/L  ? Potassium 4.5 3.5 - 5.2 mmol/L  ? Chloride 104 96 - 106 mmol/L  ? CO2 23 20 - 29 mmol/L  ? Calcium 9.0 8.7 - 10.2 mg/dL  ? Total Protein 7.3 6.0 - 8.5 g/dL  ? Albumin 4.0 3.8 - 4.8 g/dL  ? Globulin, Total 3.3 1.5 - 4.5 g/dL  ? Albumin/Globulin Ratio 1.2 1.2 - 2.2  ? Bilirubin Total 0.3 0.0 - 1.2 mg/dL  ? Alkaline Phosphatase 82 44 - 121 IU/L  ? AST 15 0 - 40 IU/L  ? ALT 8 0 - 32 IU/L  ?Lipid panel  ?Result Value Ref Range  ? Cholesterol, Total 189 100 - 199 mg/dL  ? Triglycerides 48 0 - 149 mg/dL  ? HDL 63 >39 mg/dL  ? VLDL Cholesterol Cal 9 5 - 40 mg/dL  ? LDL Chol Calc (NIH) 117 (H) 0 - 99 mg/dL  ? Chol/HDL Ratio 3.0 0.0 - 4.4 ratio  ?Hemoglobin A1c  ?Result Value Ref Range  ? Hgb A1c MFr Bld 5.7 (H) 4.8 - 5.6 %  ? Est. average glucose Bld gHb Est-mCnc 117 mg/dL  ?  ? ? ? ?The ASCVD Risk score (Arnett DK, et al., 2019) failed to calculate for the following reasons: ?  The 2019 ASCVD risk score is only valid for ages 59 to 2 ? ?  ?Assessment & Plan:  ? ?Problem List Items Addressed This Visit   ? ?  ? Other  ? Prediabetes  ?  controlled, eat a low sugar diet, avoid starchy food with a lot of carbohydrates, avoid fried and processed foods; last hgb a1c 5.7 on 11/23/2021 ? ? ?  ?  ? Severe obesity (BMI >= 40) (HCC) - Primary  ?  Taking Phentermine 15 mg qd, lost 6 pounds since 11/23/21 and is tolerating the medicine well; applauded weight loss and encouraged eating fruits and vegetables, continue drinking  water and exercising ? ?  ?  ? Relevant Medications  ? phentermine 30 MG capsule   ? ? ?Meds ordered this encounter  ?Medications  ? phentermine 30 MG capsule  ?  Sig: Take 1 capsule (30 mg total) by mouth every morning.  ?  Dispense:  30 capsule  ?  Refill:  0  ?  Order Specific Question:   Supervising Provider  ?  Answer:   Denita Lung [[id number]]  ? ? ?Follow-up: Return in about 4 weeks (around 01/23/2022) for Return for a Follow Up Appointment Chronic Disease.  ? ? ?Irene Pap, PA-C ?

## 2021-12-26 ENCOUNTER — Ambulatory Visit: Payer: Federal, State, Local not specified - PPO | Admitting: Physician Assistant

## 2021-12-26 ENCOUNTER — Encounter: Payer: Self-pay | Admitting: Physician Assistant

## 2021-12-26 DIAGNOSIS — R7303 Prediabetes: Secondary | ICD-10-CM

## 2021-12-26 MED ORDER — PHENTERMINE HCL 30 MG PO CAPS: 30.0000 mg | ORAL_CAPSULE | ORAL | 0 refills | Status: DC

## 2021-12-26 NOTE — Assessment & Plan Note (Addendum)
Taking Phentermine 15 mg qd, lost 6 pounds since 11/23/21 and is tolerating the medicine well; applauded weight loss and encouraged eating fruits and vegetables, continue drinking water and exercising ?

## 2021-12-26 NOTE — Assessment & Plan Note (Signed)
controlled, eat a low sugar diet, avoid starchy food with a lot of carbohydrates, avoid fried and processed foods; last hgb a1c 5.7 on 11/23/2021 ? ?

## 2021-12-26 NOTE — Patient Instructions (Signed)
Wt Readings from Last 3 Encounters:  ?12/26/21 251 lb 3.2 oz (113.9 kg)  ?11/23/21 257 lb 3.2 oz (116.7 kg)  ?12/08/20 245 lb 9.6 oz (111.4 kg)  ? ? ?

## 2022-02-02 ENCOUNTER — Ambulatory Visit: Payer: Federal, State, Local not specified - PPO | Admitting: Physician Assistant

## 2022-02-02 ENCOUNTER — Encounter: Payer: Self-pay | Admitting: Physician Assistant

## 2022-02-02 MED ORDER — PHENTERMINE HCL 37.5 MG PO TABS: 37.5000 mg | ORAL_TABLET | Freq: Every day | ORAL | 0 refills | Status: DC

## 2022-02-02 NOTE — Patient Instructions (Addendum)
You will get a call to schedule an appointment with [facility]   OTC weight loss plans:  [facility] Weight Loss Plan Mediterranean Diet Intermittent Fasting Weight Watchers [facility] Weight Loss Program Nutrisystem Diet Paleo Diet Atkins Diet [facility]   You can also buy OTC Caralluma (Caralluma fimbriata) is an edible cactus that grows in [place]. It's used in preserves such as chutneys and also as medicine. Caralluma contains antioxidants and chemicals that might reduce appetite.

## 2022-02-02 NOTE — Assessment & Plan Note (Signed)
Finish 30 day supply of Phentermine 30 mg daily, take 30 day supply of phentermine 37.5 mg daily, then stop; increase exercise, decrease chips and cookies and replace with low sugar fruits like berries, cucumbers, celery, etc, referral to [facility]

## 2022-02-02 NOTE — Progress Notes (Signed)
Established Patient Office Visit  Subjective:  Patient ID: Stephanie Santos, female    DOB: [birth year]  Age: 31 y.o. MRN: [id number]  CC:  Chief Complaint  Patient presents with   Follow-up    Follow up on weight loss. She said she doesn't see much change   Medication Refill    HPI Stephanie Santos presents for weight loss follow up while taking phentermine, lost 7 pounds since March 2023, states the medicine helps to suppress her appetite somewhat; states she's eating twice a day, at 2 pm and 8 pm, is eating fruits and vegetables, chicken and shrimp, but admits that she is also snacking with chips & cookies; drinks 6 bottles of water a day; exercising 7 days a week with about a 10 minute walk when walking her dog  Outpatient Medications Prior to Visit  Medication Sig Dispense Refill   albuterol (VENTOLIN HFA) 108 (90 Base) MCG/ACT inhaler Inhale 2 puffs into the lungs every 6 (six) hours as needed for wheezing or shortness of breath. 18 g 1   phentermine 30 MG capsule Take 1 capsule (30 mg total) by mouth every morning. 30 capsule 0   No facility-administered medications prior to visit.    No Known Allergies  Patient Care Team: Marcellina Millin as PCP - General (Physician Assistant) Tamela Gammon, NP as Nurse Practitioner (Gynecology)  ROS Review of Systems  Constitutional:  Negative for activity change and chills.  HENT:  Negative for congestion and voice change.   Eyes:  Negative for pain and redness.  Respiratory:  Negative for cough and wheezing.   Cardiovascular:  Negative for chest pain and palpitations.  Gastrointestinal:  Negative for constipation, diarrhea, nausea and vomiting.  Endocrine: Negative for polyuria.  Genitourinary:  Negative for frequency.  Skin:  Negative for color change and rash.  Allergic/Immunologic: Negative for immunocompromised state.  Neurological:  Negative for dizziness.  Psychiatric/Behavioral:  Negative for agitation and sleep  disturbance.      Objective:    Physical Exam Vitals and nursing note reviewed.  Constitutional:      General: She is not in acute distress.    Appearance: She is normal weight. She is not ill-appearing.  HENT:     Head: Normocephalic and atraumatic.     Right Ear: External ear normal.     Left Ear: External ear normal.  Eyes:     Extraocular Movements: Extraocular movements intact.     Conjunctiva/sclera: Conjunctivae normal.     Pupils: Pupils are equal, round, and reactive to light.  Cardiovascular:     Rate and Rhythm: Normal rate and regular rhythm.  Pulmonary:     Effort: Pulmonary effort is normal.     Breath sounds: Normal breath sounds. No wheezing.  Abdominal:     General: Bowel sounds are normal.     Palpations: Abdomen is soft.  Musculoskeletal:        General: Normal range of motion.     Cervical back: Normal range of motion.  Skin:    General: Skin is warm and dry.  Neurological:     Mental Status: She is alert and oriented to person, place, and time.  Psychiatric:        Mood and Affect: Mood normal.    BP 110/80   Pulse 87   Wt 250 lb (113.4 kg)   SpO2 100%   BMI 40.35 kg/m   Wt Readings from Last 3 Encounters:  02/02/22 250 lb (113.4 kg)  12/26/21 251 lb 3.2 oz (113.9 kg)  11/23/21 257 lb 3.2 oz (116.7 kg)    Results for orders placed or performed in visit on 11/23/21  CBC with Differential/Platelet  Result Value Ref Range   WBC 5.5 3.4 - 10.8 x10E3/uL   RBC 5.90 (H) 3.77 - 5.28 x10E6/uL   Hemoglobin 11.5 11.1 - 15.9 g/dL   Hematocrit 38.5 34.0 - 46.6 %   MCV 65 (L) 79 - 97 fL   MCH 19.5 (L) 26.6 - 33.0 pg   MCHC 29.9 (L) 31.5 - 35.7 g/dL   RDW 19.4 (H) 11.7 - 15.4 %   Platelets 206 150 - 450 x10E3/uL   Neutrophils 56 Not Estab. %   Lymphs 33 Not Estab. %   Monocytes 8 Not Estab. %   Eos 2 Not Estab. %   Basos 1 Not Estab. %   Neutrophils Absolute 3.1 1.4 - 7.0 x10E3/uL   Lymphocytes Absolute 1.8 0.7 - 3.1 x10E3/uL   Monocytes  Absolute 0.4 0.1 - 0.9 x10E3/uL   EOS (ABSOLUTE) 0.1 0.0 - 0.4 x10E3/uL   Basophils Absolute 0.0 0.0 - 0.2 x10E3/uL   Immature Granulocytes 0 Not Estab. %   Immature Grans (Abs) 0.0 0.0 - 0.1 x10E3/uL  Comprehensive metabolic panel  Result Value Ref Range   Glucose 76 70 - 99 mg/dL   BUN 11 6 - 20 mg/dL   Creatinine, Ser 0.71 0.57 - 1.00 mg/dL   eGFR 116 >59 mL/min/1.73   BUN/Creatinine Ratio 15 9 - 23   Sodium 140 134 - 144 mmol/L   Potassium 4.5 3.5 - 5.2 mmol/L   Chloride 104 96 - 106 mmol/L   CO2 23 20 - 29 mmol/L   Calcium 9.0 8.7 - 10.2 mg/dL   Total Protein 7.3 6.0 - 8.5 g/dL   Albumin 4.0 3.8 - 4.8 g/dL   Globulin, Total 3.3 1.5 - 4.5 g/dL   Albumin/Globulin Ratio 1.2 1.2 - 2.2   Bilirubin Total 0.3 0.0 - 1.2 mg/dL   Alkaline Phosphatase 82 44 - 121 IU/L   AST 15 0 - 40 IU/L   ALT 8 0 - 32 IU/L  Lipid panel  Result Value Ref Range   Cholesterol, Total 189 100 - 199 mg/dL   Triglycerides 48 0 - 149 mg/dL   HDL 63 >39 mg/dL   VLDL Cholesterol Cal 9 5 - 40 mg/dL   LDL Chol Calc (NIH) 117 (H) 0 - 99 mg/dL   Chol/HDL Ratio 3.0 0.0 - 4.4 ratio  Hemoglobin A1c  Result Value Ref Range   Hgb A1c MFr Bld 5.7 (H) 4.8 - 5.6 %   Est. average glucose Bld gHb Est-mCnc 117 mg/dL     Last CBC Lab Results  Component Value Date   WBC 5.5 11/23/2021   HGB 11.5 11/23/2021   HCT 38.5 11/23/2021   MCV 65 (L) 11/23/2021   MCH 19.5 (L) 11/23/2021   RDW 19.4 (H) 11/23/2021   PLT 206 49/44/9675   Last metabolic panel Lab Results  Component Value Date   GLUCOSE 76 11/23/2021   NA 140 11/23/2021   K 4.5 11/23/2021   CL 104 11/23/2021   CO2 23 11/23/2021   BUN 11 11/23/2021   CREATININE 0.71 11/23/2021   EGFR 116 11/23/2021   CALCIUM 9.0 11/23/2021   PROT 7.3 11/23/2021   ALBUMIN 4.0 11/23/2021   LABGLOB 3.3 11/23/2021   AGRATIO 1.2 11/23/2021   BILITOT 0.3 11/23/2021   ALKPHOS 82 11/23/2021   AST  15 11/23/2021   ALT 8 11/23/2021   ANIONGAP 10 02/02/2020   Last  lipids Lab Results  Component Value Date   CHOL 189 11/23/2021   HDL 63 11/23/2021   LDLCALC 117 (H) 11/23/2021   TRIG 48 11/23/2021   CHOLHDL 3.0 11/23/2021   Last hemoglobin A1c Lab Results  Component Value Date   HGBA1C 5.7 (H) 11/23/2021   Last thyroid functions Lab Results  Component Value Date   TSH 1.670 06/30/2020   T3TOTAL 112 06/30/2020   Last vitamin D No results found for: 25OHVITD2, 25OHVITD3, VD25OH Last vitamin B12 and Folate No results found for: VITAMINB12, FOLATE    The ASCVD Risk score (Arnett DK, et al., 2019) failed to calculate for the following reasons:   The 2019 ASCVD risk score is only valid for ages 17 to 72    Assessment & Plan:   Problem List Items Addressed This Visit       Other   Severe obesity (BMI >= 40) ([place]) - Primary   Relevant Medications   phentermine (ADIPEX-P) 37.5 MG tablet   Other Relevant Orders   Amb Ref to [facility]    Meds ordered this encounter  Medications   phentermine (ADIPEX-P) 37.5 MG tablet    Sig: Take 1 tablet (37.5 mg total) by mouth daily before breakfast.    Dispense:  30 tablet    Refill:  0    Order Specific Question:   Supervising Provider    Answer:   Denita Lung [[id number]]    Follow-up: Return in about 8 weeks (around 03/30/2022) for Return for a Follow Up Appointment Chronic Disease.    Irene Pap, PA-C

## 2022-02-12 NOTE — Progress Notes (Deleted)
   Stephanie Santos 1990/09/28 [id number]   History:  31 y.o. G0 presents as new patient for gynecological exam. She has been with the same partner for 8 years without contraception. Recommended using app to track cycles and ovulation. Last year normal FSH, LH, AMH, and 21-day progesterone. Estradiol not collected on day 3. No history of pregnancy. Normal pap history. Pre-diabetes, weight managed by PCP, on phentermine.  Gynecologic History No LMP recorded.   Contraception/Family planning: none Sexually active: Yes  Health Maintenance Last Pap: 2020. Results were: Normal Last mammogram: Not indicated Last colonoscopy: Not indicated Last Dexa: Not indicated  Past medical history, past surgical history, family history and social history were all reviewed and documented in the EPIC chart.  ROS:  A ROS was performed and pertinent positives and negatives are included.  Exam:  There were no vitals filed for this visit.  There is no height or weight on file to calculate BMI.  General appearance:  Normal Thyroid:  Symmetrical, normal in size, without palpable masses or nodularity. Respiratory  Auscultation:  Clear without wheezing or rhonchi Cardiovascular  Auscultation:  Regular rate, without rubs, murmurs or gallops  Edema/varicosities:  Not grossly evident Abdominal  Soft,nontender, without masses, guarding or rebound.  Liver/spleen:  No organomegaly noted  Hernia:  None appreciated  Skin  Inspection:  Grossly normal   Breasts: Examined lying and sitting.   Right: Without masses, retractions, discharge or axillary adenopathy.   Left: Without masses, retractions, discharge or axillary adenopathy. Genitourinary   Inguinal/mons:  Normal without inguinal adenopathy  External genitalia:  Normal appearing vulva with no masses, tenderness, or lesions  BUS/Urethra/Skene's glands:  Normal  Vagina:  Normal appearing with normal color and discharge, no lesions  Cervix:  Normal appearing  without discharge or lesions  Uterus:  Normal in size, shape and contour.  Midline and mobile, nontender  Adnexa/parametria:     Rt: Normal in size, without masses or tenderness.   Lt: Normal in size, without masses or tenderness.  Anus and perineum: Normal  Digital rectal exam: Not indicated  Patient informed chaperone available to be present for breast and pelvic exam. Patient has requested no chaperone to be present. Patient has been advised what will be completed during breast and pelvic exam.   Assessment/Plan:  31 y.o. G0 for annual exam.   Well female exam with routine gynecological exam - Education provided on SBEs, importance of preventative screenings, current guidelines, high calcium diet, regular exercise, and multivitamin daily. Preventative labs with PCP.   Primary female infertility - She will return for day 3 FSH. At that time we ill also check LH, estradiol, and AMH. She will also return for a 21-day progesterone. Recommended that her partner have sperm analysis completed. If blood work is normal we discussed ovulation induction for 3 months and if unsuccessful we will refer to fertility specialist. Also recommend downloading app for cycle and ovulation tracking and increasing frequency of intercourse around ovulation. Normal TSH history, with most recent in October.   Screen for STD (sexually transmitted disease) - Plan: SURES WAB CT/NG/T. vaginalis, RPR, HIV Antibody (routine testing w rflx)  Screening for cervical cancer - normal pap history. Will repeat at 3-year interval per guidelines.   Follow up in 1 year for annual. She will return for lab work at appropriate times.     Olivia Mackie [facility], 11:47 AM 02/12/2022

## 2022-02-13 ENCOUNTER — Ambulatory Visit: Payer: Federal, State, Local not specified - PPO | Admitting: Nurse Practitioner

## 2022-02-13 DIAGNOSIS — Z01419 Encounter for gynecological examination (general) (routine) without abnormal findings: Secondary | ICD-10-CM

## 2022-02-13 DIAGNOSIS — Z0289 Encounter for other administrative examinations: Secondary | ICD-10-CM

## 2022-04-04 ENCOUNTER — Ambulatory Visit: Payer: Federal, State, Local not specified - PPO | Admitting: Physician Assistant

## 2022-05-26 ENCOUNTER — Telehealth: Payer: Self-pay | Admitting: Physician Assistant

## 2022-05-26 NOTE — Telephone Encounter (Signed)
P.A. PHENTERMINE  

## 2022-05-30 NOTE — Telephone Encounter (Signed)
P.A. approved til 05/26/23 sent mychart message

## 2022-07-24 ENCOUNTER — Encounter: Payer: Self-pay | Admitting: Internal Medicine

## 2022-08-08 ENCOUNTER — Other Ambulatory Visit: Payer: Self-pay | Admitting: Physician Assistant

## 2022-08-09 ENCOUNTER — Encounter: Payer: Self-pay | Admitting: Nurse Practitioner

## 2022-08-09 ENCOUNTER — Other Ambulatory Visit (HOSPITAL_COMMUNITY)
Admission: RE | Admit: 2022-08-09 | Discharge: 2022-08-09 | Disposition: A | Discharge status: Home / Self Care | Payer: Federal, State, Local not specified - PPO | Source: Ambulatory Visit | Attending: Nurse Practitioner | Admitting: Nurse Practitioner

## 2022-08-09 ENCOUNTER — Ambulatory Visit (INDEPENDENT_AMBULATORY_CARE_PROVIDER_SITE_OTHER): Payer: Federal, State, Local not specified - PPO | Admitting: Nurse Practitioner

## 2022-08-09 VITALS — BP 108/66 | HR 64 | Resp 12 | Ht 65.0 in | Wt 255.0 lb

## 2022-08-09 DIAGNOSIS — Z113 Encounter for screening for infections with a predominantly sexual mode of transmission: Secondary | ICD-10-CM | POA: Insufficient documentation

## 2022-08-09 DIAGNOSIS — Z01419 Encounter for gynecological examination (general) (routine) without abnormal findings: Secondary | ICD-10-CM | POA: Insufficient documentation

## 2022-08-09 NOTE — Progress Notes (Signed)
   Rifka Tonelli October 13, 1990 [id number]   History:  31 y.o. G0 presents for annual exam. No GYN complaints. Monthly cycles. Normal pap history. Pre-diabetes managed by PCP.  Gynecologic History Patient's last menstrual period was 07/31/2022. Period Cycle (Days): 28 Period Duration (Days): 5 Period Pattern: Regular Menstrual Flow: Heavy, Moderate Menstrual Control: Tampon, Maxi pad Menstrual Control Change Freq (Hours): changes pad/tampon 4-5x day Dysmenorrhea: (!) Moderate Dysmenorrhea Symptoms: Cramping, Nausea Contraception/Family planning: none  Health Maintenance Last Pap: 2020. Results were: Normal Last mammogram: Not indicated Last colonoscopy: Not indicated Last Dexa: Not indicated  Past medical history, past surgical history, family history and social history were all reviewed and documented in the EPIC chart. Works for [employer]. Boyfriend of 10 years, lives in [place] where she is from.   ROS:  A ROS was performed and pertinent positives and negatives are included.  Exam:  Vitals:   08/09/22 1150  BP: 108/66  Pulse: 64  Resp: 12  Weight: 255 lb (115.7 kg)  Height: 5\' 5"  (1.651 m)    Body mass index is 42.43 kg/m.  General appearance:  Normal Thyroid:  Symmetrical, normal in size, without palpable masses or nodularity. Respiratory  Auscultation:  Clear without wheezing or rhonchi Cardiovascular  Auscultation:  Regular rate, without rubs, murmurs or gallops  Edema/varicosities:  Not grossly evident Abdominal  Soft,nontender, without masses, guarding or rebound.  Liver/spleen:  No organomegaly noted  Hernia:  None appreciated  Skin  Inspection:  Grossly normal Breasts: Examined lying and sitting.   Right: Without masses, retractions, nipple discharge or axillary adenopathy.   Left: Without masses, retractions, nipple discharge or axillary adenopathy. Genitourinary   Inguinal/mons:  Normal without inguinal adenopathy  External genitalia:   Normal appearing vulva with no masses, tenderness, or lesions  BUS/Urethra/Skene's glands:  Normal  Vagina:  Normal appearing with normal color and discharge, no lesions  Cervix:  Normal appearing without discharge or lesions  Uterus:  Normal in size, shape and contour.  Midline and mobile, nontender  Adnexa/parametria:     Rt: Normal in size, without masses or tenderness.   Lt: Normal in size, without masses or tenderness.  Anus and perineum: Normal  Digital rectal exam: Not indicated  Patient informed chaperone available to be present for breast and pelvic exam. Patient has requested no chaperone to be present. Patient has been advised what will be completed during breast and pelvic exam.   Assessment/Plan:  31 y.o. G0 for annual exam.   Well female exam with routine gynecological exam - Plan: Cytology - PAP( [place]). Education provided on SBEs, importance of preventative screenings, current guidelines, high calcium diet, regular exercise, and multivitamin daily. Labs with PCP.   Screening examination for STD (sexually transmitted disease) - Plan: Cytology - PAP( [place]), RPR, HIV Antibody (routine testing w rflx). GC/CT/trich added to pap.   Screening for cervical cancer - Normal pap history. Pap today.  Follow up in 1 year for annual.     [facility], 11:56 AM 08/09/2022

## 2022-08-10 LAB — CYTOLOGY - PAP
Chlamydia: NEGATIVE
Comment: NEGATIVE
Comment: NEGATIVE
Comment: NEGATIVE
Comment: NORMAL
Diagnosis: NEGATIVE
High risk HPV: NEGATIVE
Neisseria Gonorrhea: NEGATIVE
Trichomonas: NEGATIVE

## 2022-08-10 LAB — RPR: RPR Ser Ql: NONREACTIVE

## 2022-08-10 LAB — HIV ANTIBODY (ROUTINE TESTING W REFLEX): HIV 1&2 Ab, 4th Generation: NONREACTIVE

## 2022-11-26 ENCOUNTER — Encounter: Payer: Federal, State, Local not specified - PPO | Admitting: Physician Assistant

## 2023-05-09 ENCOUNTER — Ambulatory Visit: Payer: Federal, State, Local not specified - PPO | Admitting: Nurse Practitioner

## 2023-05-15 ENCOUNTER — Encounter: Payer: Self-pay | Admitting: Nurse Practitioner

## 2023-05-15 ENCOUNTER — Ambulatory Visit: Payer: Federal, State, Local not specified - PPO | Admitting: Nurse Practitioner

## 2023-05-15 VITALS — BP 128/70 | HR 70 | Wt 256.0 lb

## 2023-05-15 DIAGNOSIS — Z113 Encounter for screening for infections with a predominantly sexual mode of transmission: Secondary | ICD-10-CM | POA: Diagnosis not present

## 2023-05-15 NOTE — Progress Notes (Signed)
   Acute Office Visit  Subjective:    Patient ID: Stephanie Santos, female    DOB: 1991-08-19, 32 y.o.   MRN: [id number]   HPI 32 y.o. G0 presents today for STD screening. No symptoms or known exposure.   Patient's last menstrual period was 05/02/2023.    Review of Systems  Constitutional: Negative.   Genitourinary: Negative.        Objective:    Physical Exam Constitutional:      Appearance: Normal appearance.  Genitourinary:    General: Normal vulva.     Vagina: Normal.     Cervix: Normal.     BP 128/70   Pulse 70   Wt 256 lb (116.1 kg)   LMP 05/02/2023   SpO2 100%   BMI 42.60 kg/m  Wt Readings from Last 3 Encounters:  05/15/23 256 lb (116.1 kg)  08/09/22 255 lb (115.7 kg)  02/02/22 250 lb (113.4 kg)        Patient informed chaperone available to be present for breast and/or pelvic exam. Patient has requested no chaperone to be present. Patient has been advised what will be completed during breast and pelvic exam.   Assessment & Plan:   Problem List Items Addressed This Visit   None Visit Diagnoses     Screening examination for STD (sexually transmitted disease)    -  Primary   Relevant Orders   SURESWAB CT/NG/T. vaginalis   HIV Antibody (routine testing w rflx)   RPR      Plan: STD panel pending.     Olivia Mackie DNP, 8:38 AM 05/15/2023

## 2023-05-16 LAB — RPR: RPR Ser Ql: NONREACTIVE

## 2023-05-16 LAB — SURESWAB CT/NG/T. VAGINALIS
C. trachomatis RNA, TMA: NOT DETECTED
N. gonorrhoeae RNA, TMA: NOT DETECTED
Trichomonas vaginalis RNA: NOT DETECTED

## 2023-05-16 LAB — HIV ANTIBODY (ROUTINE TESTING W REFLEX): HIV 1&2 Ab, 4th Generation: NONREACTIVE

## 2023-06-28 ENCOUNTER — Encounter: Payer: Federal, State, Local not specified - PPO | Admitting: Radiology

## 2023-07-02 ENCOUNTER — Ambulatory Visit: Payer: Federal, State, Local not specified - PPO | Admitting: Obstetrics and Gynecology

## 2023-07-02 NOTE — Progress Notes (Deleted)
GYNECOLOGY  VISIT   HPI: 32 y.o.   Single  African American  female   G0P0000 with No LMP recorded.   here for   irritation  GYNECOLOGIC HISTORY: No LMP recorded. Contraception:  2020 normal Menopausal hormone therapy:  n/a Last mammogram:  n/a Last pap smear:   08/09/22 neg: HR HPV neg         OB History     Gravida  0   Para  0   Term  0   Preterm  0   AB  0   Living  0      SAB  0   IAB  0   Ectopic  0   Multiple  0   Live Births  0              Patient Active Problem List   Diagnosis Date Noted   Severe obesity (BMI >= 40) (HCC) 11/23/2021   History of gout 12/08/2020   Acquired deformity of nail 12/08/2020   Prediabetes 07/01/2020   Low serum calcium 07/01/2020   Anemia 07/01/2020   Family history of diabetes mellitus in mother 06/30/2020   Asthma     Past Medical History:  Diagnosis Date   Anemia 07/01/2020   Asthma    Low serum calcium 07/01/2020   Morbid obesity (HCC) 12/08/2020   Obesity (BMI 30-39.9) 06/30/2020   Prediabetes 07/01/2020    No past surgical history on file.  No current outpatient medications on file.   No current facility-administered medications for this visit.     ALLERGIES: Patient has no known allergies.  Family History  Problem Relation Age of Onset   Pancreatic cancer Mother 48   Ovarian cancer Maternal Grandmother    Diabetes Maternal Grandmother    Cancer Maternal Grandmother        OVARIAN     Social History   Socioeconomic History   Marital status: Single    Spouse name: Not on file   Number of children: Not on file   Years of education: Not on file   Highest education level: Not on file  Occupational History   Not on file  Tobacco Use   Smoking status: Never    Passive exposure: Past   Smokeless tobacco: Never  Vaping Use   Vaping status: Never Used  Substance and Sexual Activity   Alcohol use: Not Currently    Comment: RARE   Drug use: Never   Sexual activity: Yes    Partners:  Male    Comment: 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    General appearance: alert, cooperative and appears stated age Head: Normocephalic, without obvious abnormality, atraumatic Neck: no adenopathy, supple, symmetrical, trachea midline and thyroid normal to inspection and palpation Lungs: clear to auscultation bilaterally Breasts: normal appearance, no masses or tenderness, No nipple retraction or dimpling, No nipple discharge or bleeding, No axillary or supraclavicular adenopathy Heart: regular rate and rhythm Abdomen: soft, non-tender,  no masses,  no organomegaly Extremities: extremities normal, atraumatic, no cyanosis or edema Skin: Skin color, texture, turgor normal. No rashes or lesions Lymph nodes: Cervical, supraclavicular, and axillary nodes normal. No abnormal inguinal nodes palpated Neurologic: Grossly normal  Pelvic: External genitalia:  no lesions              Urethra:  normal appearing urethra with no masses, tenderness or lesions              Bartholins and Skenes: normal                 Vagina: normal appearing vagina with normal color and discharge, no lesions              Cervix: no lesions                Bimanual Exam:  Uterus:  normal size, contour, position, consistency, mobility, non-tender              Adnexa: no mass, fullness, tenderness              Rectal exam: {yes no:[id number]}.  Confirms.              Anus:  normal sphincter tone, no lesions  Chaperone was present for exam:  ***  ASSESSMENT      PLAN     An After Visit Summary was printed and given to the patient.  ______ minutes face to face time of which over 50% was spent in counseling.

## 2023-07-10 ENCOUNTER — Ambulatory Visit (HOSPITAL_COMMUNITY)
Admission: EM | Admit: 2023-07-10 | Discharge: 2023-07-10 | Disposition: A | Discharge status: Home / Self Care | Payer: Federal, State, Local not specified - PPO | Attending: Internal Medicine | Admitting: Internal Medicine

## 2023-07-10 ENCOUNTER — Encounter (HOSPITAL_COMMUNITY): Payer: Self-pay

## 2023-07-10 DIAGNOSIS — Z1152 Encounter for screening for COVID-19: Secondary | ICD-10-CM | POA: Diagnosis not present

## 2023-07-10 DIAGNOSIS — J069 Acute upper respiratory infection, unspecified: Secondary | ICD-10-CM | POA: Diagnosis not present

## 2023-07-10 DIAGNOSIS — Z791 Long term (current) use of non-steroidal anti-inflammatories (NSAID): Secondary | ICD-10-CM | POA: Insufficient documentation

## 2023-07-10 DIAGNOSIS — Z3201 Encounter for pregnancy test, result positive: Secondary | ICD-10-CM | POA: Insufficient documentation

## 2023-07-10 LAB — POCT URINE PREGNANCY: Preg Test, Ur: POSITIVE — AB

## 2023-07-10 LAB — SARS CORONAVIRUS 2 (TAT 6-24 HRS): SARS Coronavirus 2: NEGATIVE

## 2023-07-10 NOTE — ED Provider Notes (Signed)
[facility]    CSN: [id number] Arrival date & time: 07/10/23  0908      History   Chief Complaint Chief Complaint  Patient presents with   Fever    HPI Stephanie Santos is a 32 y.o. female.   Patient presents to urgent care for evaluation of generalized fatigue, hot sweats/cold chills, and sinus pressure with nasal congestion that started 3 days ago on Sunday, July 07, 2023.  No recent sick contacts with similar symptoms.  She denies headaches, cough, nausea, vomiting, diarrhea, abdominal pain, rash, documented fever at home, and shortness of breath/wheeze.  History of asthma, has not needed to use her albuterol inhaler during this illness.  Taking over-the-counter cold and flu medications with some relief. Last menstrual cycle May 27, 2023.  Menstrual cycles are usually rather irregular.  She is unsure of pregnancy status and states there is a chance she could be pregnant.  Denies urinary symptoms.   Fever   Past Medical History:  Diagnosis Date   Anemia 07/01/2020   Asthma    Low serum calcium 07/01/2020   Morbid obesity (HCC) 12/08/2020   Obesity (BMI 30-39.9) 06/30/2020   Prediabetes 07/01/2020    Patient Active Problem List   Diagnosis Date Noted   Severe obesity (BMI >= 40) (HCC) 11/23/2021   History of gout 12/08/2020   Acquired deformity of nail 12/08/2020   Prediabetes 07/01/2020   Low serum calcium 07/01/2020   Anemia 07/01/2020   Family history of diabetes mellitus in mother 06/30/2020   Asthma     History reviewed. No pertinent surgical history.  OB History     Gravida  1   Para  0   Term  0   Preterm  0   AB  0   Living  0      SAB  0   IAB  0   Ectopic  0   Multiple  0   Live Births  0            Home Medications    Prior to Admission medications   Not on File    Family History Family History  Problem Relation Age of Onset   Pancreatic cancer Mother 30   Ovarian cancer Maternal Grandmother     Diabetes Maternal Grandmother    Cancer Maternal Grandmother        OVARIAN     Social History Social History   Tobacco Use   Smoking status: Never    Passive exposure: Past   Smokeless tobacco: Never  Vaping Use   Vaping status: Never Used  Substance Use Topics   Alcohol use: Not Currently    Comment: RARE   Drug use: Never     Allergies   Patient has no known allergies.   Review of Systems Review of Systems  Constitutional:  Positive for fever.  Per HPI   Physical Exam Triage Vital Signs ED Triage Vitals  Encounter Vitals Group     BP 07/10/23 0946 97/67     Systolic BP Percentile --      Diastolic BP Percentile --      Pulse Rate 07/10/23 0946 73     Resp 07/10/23 0946 16     Temp 07/10/23 0946 98.2 F (36.8 C)     Temp Source 07/10/23 0946 Oral     SpO2 07/10/23 0946 99 %     Weight --      Height --      Head  Circumference --      Peak Flow --      Pain Score 07/10/23 0948 0     Pain Loc --      Pain Education --      Exclude from Growth Chart --    No data found.  Updated Vital Signs BP 97/67 (BP Location: Left Arm)   Pulse 73   Temp 98.2 F (36.8 C) (Oral)   Resp 16   LMP 05/27/2023 (Exact Date)   SpO2 99%   Visual Acuity Right Eye Distance:   Left Eye Distance:   Bilateral Distance:    Right Eye Near:   Left Eye Near:    Bilateral Near:     Physical Exam Vitals and nursing note reviewed.  Constitutional:      Appearance: She is not ill-appearing or toxic-appearing.  HENT:     Head: Normocephalic and atraumatic.     Right Ear: Hearing, tympanic membrane, ear canal and external ear normal.     Left Ear: Hearing, tympanic membrane, ear canal and external ear normal.     Nose: Congestion present.     Mouth/Throat:     Lips: Pink.     Mouth: Mucous membranes are moist. No injury.     Tongue: No lesions. Tongue does not deviate from midline.     Palate: No mass and lesions.     Pharynx: Oropharynx is clear. Uvula midline. No  pharyngeal swelling, oropharyngeal exudate, posterior oropharyngeal erythema or uvula swelling.     Tonsils: No tonsillar exudate or tonsillar abscesses.  Eyes:     General: Lids are normal. Vision grossly intact. Gaze aligned appropriately.     Extraocular Movements: Extraocular movements intact.     Conjunctiva/sclera: Conjunctivae normal.  Cardiovascular:     Rate and Rhythm: Normal rate and regular rhythm.     Heart sounds: Normal heart sounds, S1 normal and S2 normal.  Pulmonary:     Effort: Pulmonary effort is normal. No respiratory distress.     Breath sounds: Normal breath sounds and air entry.  Musculoskeletal:     Cervical back: Neck supple.  Skin:    General: Skin is warm and dry.     Capillary Refill: Capillary refill takes less than 2 seconds.     Findings: No rash.  Neurological:     General: No focal deficit present.     Mental Status: She is alert and oriented to person, place, and time. Mental status is at baseline.     Cranial Nerves: No dysarthria or facial asymmetry.  Psychiatric:        Mood and Affect: Mood normal.        Speech: Speech normal.        Behavior: Behavior normal.        Thought Content: Thought content normal.        Judgment: Judgment normal.      UC Treatments / Results  Labs (all labs ordered are listed, but only abnormal results are displayed) Labs Reviewed  POCT URINE PREGNANCY - Abnormal; Notable for the following components:      Result Value   Preg Test, Ur Positive (*)    All other components within normal limits  SARS CORONAVIRUS 2 (TAT 6-24 HRS)    EKG   Radiology No results found.  Procedures Procedures (including critical care time)  Medications Ordered in UC Medications - No data to display  Initial Impression / Assessment and Plan / UC Course  I have  reviewed the triage vital signs and the nursing notes.  Pertinent labs & imaging results that were available during my care of the patient were reviewed by me  and considered in my medical decision making (see chart for details).   1. Viral URI with cough Suspect viral URI, viral syndrome. Physical exam findings reassuring, vital signs hemodynamically stable. Low suspicion for pneumonia/acute cardiopulmonary abnormality, therefore deferred imaging of the chest. Advised supportive care, offered prescriptions for symptomatic relief.  Recommend continued use of OTC medications as needed, recommendations discussed with patient/caregiver and outlined in AVS.  Strep/viral testing: COVID-19 testing pending, she is a candidate for antiviral therapy if she tests positive.  2. Positive pregnancy test Urine pregnancy test is positive in the clinic.   Prenatal vitamin daily, stop ibuprofen, may take tylenol for pain instead.  Given list of safe OTC medications in pregnancy.  OB/GYN provider list given, advised to schedule appointment for prenatal care MAU precautions given, no current red flag signs of pregnancy related emergency.   Counseled patient on potential for adverse effects with medications prescribed/recommended today, strict ER and return-to-clinic precautions discussed, patient verbalized understanding.    Final Clinical Impressions(s) / UC Diagnoses   Final diagnoses:  Viral URI with cough  Positive urine pregnancy test     Discharge Instructions      You have a viral illness which will improve on its own with rest, fluids, and medications to help with your symptoms. You may use over the counter medicines as needed: tylenol/motrin, mucinex, zyrtec, Flonase Two teaspoons of honey in 1 cup of warm water every 4-6 hours may help with throat pains. Humidifier in room at nighttime may help soothe cough (clean well daily).   Your pregnancy test is positive.  - Refer to the list of safe medications in pregnancy you were given at urgent care today and do not take any other medications over-the-counter other than what is on this list.  You may  only take Tylenol for pain and discomfort/fever.  Do not take ibuprofen in pregnancy.  - Drink plenty of water (at least 8 cups/day) to stay well-hydrated.  - Purchase a prenatal vitamin over the counter and take this once daily.  - Avoid smoking cigarettes, marijuana, and other drug use. Do not drink alcohol while pregnant.  If you develop any severe abdominal pain, severe low back pain, vaginal bleeding, or any other pregnancy related emergency, please go to the maternity assessment unit located at entrance C of [facility].  Call the OB/GYN listed below to schedule an appointment to establish care so that you can be monitored throughout your pregnancy.   Center for [facility]            [address] [place], [place] [address] [phone]   Center for [facility] at [facility]                                                            [address], [place], [place], [address] [phone]      ED Prescriptions   None    PDMP not reviewed this encounter.   Carlisle Beers, [place] 07/10/23 1052

## 2023-07-10 NOTE — Discharge Instructions (Addendum)
You have a viral illness which will improve on its own with rest, fluids, and medications to help with your symptoms. You may use over the counter medicines as needed: tylenol/motrin, mucinex, zyrtec, Flonase Two teaspoons of honey in 1 cup of warm water every 4-6 hours may help with throat pains. Humidifier in room at nighttime may help soothe cough (clean well daily).   Your pregnancy test is positive.  - Refer to the list of safe medications in pregnancy you were given at urgent care today and do not take any other medications over-the-counter other than what is on this list.  You may only take Tylenol for pain and discomfort/fever.  Do not take ibuprofen in pregnancy.  - Drink plenty of water (at least 8 cups/day) to stay well-hydrated.  - Purchase a prenatal vitamin over the counter and take this once daily.  - Avoid smoking cigarettes, marijuana, and other drug use. Do not drink alcohol while pregnant.  If you develop any severe abdominal pain, severe low back pain, vaginal bleeding, or any other pregnancy related emergency, please go to the maternity assessment unit located at entrance C of [facility].  Call the OB/GYN listed below to schedule an appointment to establish care so that you can be monitored throughout your pregnancy.   Center for [facility] at [facility]            [address] [place], [place] [address] [phone]   Center for [employer] [facility] at [facility]                                                            [address], [place], [place], [address] [phone]

## 2023-07-10 NOTE — ED Triage Notes (Signed)
Pt states fever,body aches,fatigue and sinus issues for the past 3 days.  State she took Nyquil at home.

## 2023-07-24 DIAGNOSIS — Z3400 Encounter for supervision of normal first pregnancy, unspecified trimester: Secondary | ICD-10-CM | POA: Diagnosis not present

## 2023-07-25 LAB — OB RESULTS CONSOLE HIV ANTIBODY (ROUTINE TESTING): HIV: NONREACTIVE

## 2023-07-25 LAB — HEPATITIS C ANTIBODY: HCV Ab: NEGATIVE

## 2023-07-25 LAB — OB RESULTS CONSOLE ANTIBODY SCREEN: Antibody Screen: NEGATIVE

## 2023-07-25 LAB — OB RESULTS CONSOLE HEPATITIS B SURFACE ANTIGEN: Hepatitis B Surface Ag: NEGATIVE

## 2023-07-25 LAB — OB RESULTS CONSOLE RUBELLA ANTIBODY, IGM: Rubella: IMMUNE

## 2023-07-25 LAB — OB RESULTS CONSOLE RPR: RPR: NONREACTIVE

## 2023-08-14 ENCOUNTER — Other Ambulatory Visit (HOSPITAL_COMMUNITY)
Admission: RE | Admit: 2023-08-14 | Discharge: 2023-08-14 | Disposition: A | Discharge status: Home / Self Care | Payer: Federal, State, Local not specified - PPO | Source: Ambulatory Visit | Attending: Obstetrics and Gynecology | Admitting: Obstetrics and Gynecology

## 2023-08-14 ENCOUNTER — Other Ambulatory Visit: Payer: Self-pay | Admitting: Obstetrics and Gynecology

## 2023-08-14 DIAGNOSIS — Z1151 Encounter for screening for human papillomavirus (HPV): Secondary | ICD-10-CM | POA: Diagnosis not present

## 2023-08-14 DIAGNOSIS — O368390 Maternal care for abnormalities of the fetal heart rate or rhythm, unspecified trimester, not applicable or unspecified: Secondary | ICD-10-CM | POA: Diagnosis not present

## 2023-08-14 DIAGNOSIS — Z3490 Encounter for supervision of normal pregnancy, unspecified, unspecified trimester: Secondary | ICD-10-CM | POA: Insufficient documentation

## 2023-08-14 DIAGNOSIS — Z124 Encounter for screening for malignant neoplasm of cervix: Secondary | ICD-10-CM | POA: Insufficient documentation

## 2023-08-14 DIAGNOSIS — Z3401 Encounter for supervision of normal first pregnancy, first trimester: Secondary | ICD-10-CM | POA: Diagnosis not present

## 2023-08-14 LAB — OB RESULTS CONSOLE GC/CHLAMYDIA
Chlamydia: NEGATIVE
Neisseria Gonorrhea: NEGATIVE

## 2023-08-15 DIAGNOSIS — O368310 Maternal care for abnormalities of the fetal heart rate or rhythm, first trimester, not applicable or unspecified: Secondary | ICD-10-CM | POA: Diagnosis not present

## 2023-08-15 DIAGNOSIS — O99211 Obesity complicating pregnancy, first trimester: Secondary | ICD-10-CM | POA: Diagnosis not present

## 2023-08-16 LAB — CYTOLOGY - PAP
Comment: NEGATIVE
Diagnosis: NEGATIVE
High risk HPV: NEGATIVE

## 2023-09-11 NOTE — L&D Delivery Note (Addendum)
 OB/GYN [facility] Delivery Note  Stephanie Santos is a 33 y.o. G1P0000 s/p VD at [redacted]w[redacted]d. She was admitted for SOL.   ROM: 3h 27m with meconium stained fluid GBS Status:  Positive/-- (05/29 0000) Maximum Maternal Temperature: 99.0  Labor Progress: Initial SVE: 3.5/80. She then progressed to complete.   Delivery Date/Time: 02/21/2024 @1123  Delivery: Called to room and patient was complete and pushing. Head delivered OA. No nuchal cord present. Shoulder and body delivered in usual fashion with a copious amount of moderate meconium expelled with the body. Short umbilical cord noted immediately following delivery. Infant with weark spontaneous cry, dried and stimulated at the perineum. Cord clamped x 2 after 50 second delay due to poor cry and tone, the cord was cut by this provider. The infant was taken to the warm and assessed by L&D team. The code apgar initiated due to poor respiratory effort and tone. Cord gases were collected. NICU team came to assess respiratory and oxygen states. Appreciate their note for additional details. Cord blood drawn. TXA given immediately after delivery due to hx of fibroids. Placenta delivered spontaneously with gentle cord traction. Pitocin given following delivery of the placenta due to very brisk uterine bleeding. She continued to have brisk uterine bleeding. I&O cath inserted and produced ~50cc. 800 mcg cytotec per rectum given. A stat CBC and DIC panel was obtained. Labia, perineum, vagina, and cervix inspected. There was a left periurethral laceration that was repaired with good hemostasis. Fundus firm with massage following repair, and bleeding was noted to have significantly decrease and did not necessitate additional intervention such as jada balloon which was considered. Hgb stable at 9.9.  Baby Weight: 6lb 2.1 oz  Placenta: 3 vessel, intact. Short cord noted. Sent to pathology Complications: PPH (TXA, Pitocin, cytotec) Lacerations: Left periurethral repaired  with 4.0 monocryl figure of eight EBL: ~1724 mL Analgesia: Epidural   Infant:  APGAR (1 MIN): 4  APGAR (5 MINS): see NICU noted  Saman Giddens Autry-Lott, DO 02/21/2024, 1:11 PM

## 2023-09-22 ENCOUNTER — Encounter (HOSPITAL_COMMUNITY): Payer: Self-pay

## 2023-09-22 ENCOUNTER — Inpatient Hospital Stay (HOSPITAL_COMMUNITY)
Admission: AD | Admit: 2023-09-22 | Discharge: 2023-09-22 | Disposition: A | Discharge status: Home / Self Care | Payer: Federal, State, Local not specified - PPO | Attending: Obstetrics and Gynecology | Admitting: Obstetrics and Gynecology

## 2023-09-22 DIAGNOSIS — R102 Pelvic and perineal pain: Secondary | ICD-10-CM | POA: Insufficient documentation

## 2023-09-22 DIAGNOSIS — O99612 Diseases of the digestive system complicating pregnancy, second trimester: Secondary | ICD-10-CM | POA: Insufficient documentation

## 2023-09-22 DIAGNOSIS — O26899 Other specified pregnancy related conditions, unspecified trimester: Secondary | ICD-10-CM

## 2023-09-22 DIAGNOSIS — Z3A16 16 weeks gestation of pregnancy: Secondary | ICD-10-CM | POA: Diagnosis not present

## 2023-09-22 DIAGNOSIS — O99212 Obesity complicating pregnancy, second trimester: Secondary | ICD-10-CM | POA: Insufficient documentation

## 2023-09-22 DIAGNOSIS — O99512 Diseases of the respiratory system complicating pregnancy, second trimester: Secondary | ICD-10-CM | POA: Insufficient documentation

## 2023-09-22 DIAGNOSIS — K5900 Constipation, unspecified: Secondary | ICD-10-CM | POA: Insufficient documentation

## 2023-09-22 DIAGNOSIS — O26892 Other specified pregnancy related conditions, second trimester: Secondary | ICD-10-CM | POA: Diagnosis not present

## 2023-09-22 MED ORDER — CYCLOBENZAPRINE HCL 10 MG PO TABS
10.0000 mg | ORAL_TABLET | Freq: Two times a day (BID) | ORAL | 0 refills | Status: AC | PRN
Start: 2023-09-22 — End: ?

## 2023-09-22 NOTE — Discharge Instructions (Addendum)
 Comfort Measures for Round Ligament Pain:  Soak in a warm tub Tylenol 1000 mg by mouth every 6-8 hrs as needed for pain Only take Flexeril that was prescribed at bedtime for pain that is not relieved by Tylenol Slow position changes Do pelvic massages and chair stretches before getting out of bed and before getting in bed (as demonstrated to you) Laying on the affected side Apply heat only to affected area in increments of 15 mins on and 15 mins off x 1 hour then off for 1 hour completely before repeating the cycle

## 2023-09-22 NOTE — MAU Note (Addendum)
...  Stephanie Santos is a 33 y.o. at [redacted]w[redacted]d here in MAU reporting: Left lower abdominal pain since this past Thursday. She reports the pain is sharp and worsens at night when she lies down, walks, and moves. She reports her sister believes it is round ligament pain but she reports it is very painful and just wants to be sure. Denies VB or changes to her vaginal discharge. Denies recent IC.  Last BM two days ago. Taking stool softener. Last took Tylenol  this past Friday. Did not ease her pain.   Onset of complaint: This past Thursday Pain score: 4-9/10 left lower abdomen  Vitals:   09/22/23 1257  BP: 121/74  Pulse: 99  Resp: 16  Temp: 98.6 F (37 C)  SpO2: 100%      FHT: 152 doppler Lab orders placed from triage: UA

## 2023-09-22 NOTE — MAU Provider Note (Signed)
 History     CSN: [id number]  Arrival date and time: 09/22/23 1233   Event Date/Time   First Provider Initiated Contact with Patient 09/22/23 1336      Chief Complaint  Patient presents with   Abdominal Pain   HPI Ms. Stephanie Santos is a 33 y.o. year old G23P0000 female at [redacted]w[redacted]d weeks gestation who presents to MAU reporting left lower abdominal pain since Thursday, 09/19/2023.  She describes the pain as being as sharp and increases at night when she lies down, walks or moves; rates the pain 4-9/10.  She states that her sister believes it is round ligament pain.  She says that it is so painful that she just wanted to make sure that her sister is right.  She denies vaginal bleeding or any change in vaginal discharge.  She denies any recent sexual intercourse.  She also complains of constipation; last BM was 2 days ago.  She is taking stool softeners every day to help relieve the constipation.  She receives [facility] care at [facility] her next appointment as 10/08/2023.  OB History     Gravida  1   Para  0   Term  0   Preterm  0   AB  0   Living  0      SAB  0   IAB  0   Ectopic  0   Multiple  0   Live Births  0           Past Medical History:  Diagnosis Date   Anemia 07/01/2020   Asthma    Low serum calcium 07/01/2020   Morbid obesity (HCC) 12/08/2020   Obesity (BMI 30-39.9) 06/30/2020   Prediabetes 07/01/2020    History reviewed. No pertinent surgical history.  Family History  Problem Relation Age of Onset   Pancreatic cancer Mother 38   Ovarian cancer Maternal Grandmother    Diabetes Maternal Grandmother    Cancer Maternal Grandmother        OVARIAN     Social History   Tobacco Use   Smoking status: Never    Passive exposure: Past   Smokeless tobacco: Never  Vaping Use   Vaping status: Never Used  Substance Use Topics   Alcohol use: Not Currently    Comment: RARE   Drug use: Never    Allergies: No Known Allergies  No medications prior to  admission.    Review of Systems  Constitutional: Negative.   HENT: Negative.    Eyes: Negative.   Respiratory: Negative.    Cardiovascular: Negative.   Gastrointestinal: Negative.   Endocrine: Negative.   Genitourinary:  Positive for pelvic pain (Lower LT pelvic pain that increases with movement; mostly at night).  Musculoskeletal: Negative.   Skin: Negative.   Allergic/Immunologic: Negative.   Neurological: Negative.   Hematological: Negative.   Psychiatric/Behavioral: Negative.     Physical Exam   Blood pressure 121/74, pulse 99, temperature 98.6 F (37 C), temperature source Oral, resp. rate 16, height 5' 5 (1.651 m), weight 112.9 kg, last menstrual period 05/27/2023, SpO2 100%.  Physical Exam Vitals and nursing note reviewed.  Constitutional:      Appearance: Normal appearance. She is obese.  Cardiovascular:     Rate and Rhythm: Normal rate.  Pulmonary:     Effort: Pulmonary effort is normal.  Abdominal:     Palpations: Abdomen is soft.     Tenderness: There is abdominal tenderness in the right lower quadrant.  Comments: Tenderness in LT pelvic area, increases with palpation, lessens when patient raises knee up  Genitourinary:    General: Normal vulva.     Comments: Cervical exam deferred Musculoskeletal:        General: Normal range of motion.  Skin:    General: Skin is warm and dry.  Neurological:     Mental Status: She is alert and oriented to person, place, and time.  Psychiatric:        Mood and Affect: Mood normal.        Behavior: Behavior normal.        Thought Content: Thought content normal.        Judgment: Judgment normal.    FHTs by doppler: 152 bpm  MAU Course  Procedures  MDM CCUA Apply heat to affected area  No results found for this or any previous visit (from the past 24 hours).  Assessment and Plan  1. Pain of round ligament affecting pregnancy, antepartum (Primary) - Prescription for: Flexeril  10 mg po hs prn more severe  pain that is not relieved by Tylenol   - Advised to otherwise treat with Tylenol  and/or heat - Advised to apply heat only to affected area in increments of 15 mins on and 15 mins off x 1 hour then off for 1 hour completely before repeating the cycle  2. Constipation during pregnancy in second trimester - Discussed dietary changes to improve constipation (ie.  Increasing fiber) - Advised to continue stool softeners - Advised to increase daily water intake from five (5) 16.9 ounce bottles of water to at least 8 by the end of January with a goal of 10 by the end of her pregnancy  3. [redacted] weeks gestation of pregnancy   - Discharge patient - Keep scheduled appt with [facility] on 10/08/2023 - Patient verbalized an understanding of the plan of care and agrees.  Ala Cart, CNM 09/22/2023, 1:36 PM

## 2023-10-08 DIAGNOSIS — Z36 Encounter for antenatal screening for chromosomal anomalies: Secondary | ICD-10-CM | POA: Diagnosis not present

## 2023-10-22 DIAGNOSIS — Z362 Encounter for other antenatal screening follow-up: Secondary | ICD-10-CM | POA: Diagnosis not present

## 2023-12-09 DIAGNOSIS — O358XX0 Maternal care for other (suspected) fetal abnormality and damage, not applicable or unspecified: Secondary | ICD-10-CM | POA: Diagnosis not present

## 2023-12-09 DIAGNOSIS — Z3403 Encounter for supervision of normal first pregnancy, third trimester: Secondary | ICD-10-CM | POA: Diagnosis not present

## 2023-12-09 DIAGNOSIS — Z3490 Encounter for supervision of normal pregnancy, unspecified, unspecified trimester: Secondary | ICD-10-CM | POA: Diagnosis not present

## 2024-01-08 DIAGNOSIS — O3413 Maternal care for benign tumor of corpus uteri, third trimester: Secondary | ICD-10-CM | POA: Diagnosis not present

## 2024-01-08 DIAGNOSIS — D696 Thrombocytopenia, unspecified: Secondary | ICD-10-CM | POA: Diagnosis not present

## 2024-01-13 DIAGNOSIS — Z3490 Encounter for supervision of normal pregnancy, unspecified, unspecified trimester: Secondary | ICD-10-CM | POA: Diagnosis not present

## 2024-01-13 DIAGNOSIS — N898 Other specified noninflammatory disorders of vagina: Secondary | ICD-10-CM | POA: Diagnosis not present

## 2024-01-13 DIAGNOSIS — O4700 False labor before 37 completed weeks of gestation, unspecified trimester: Secondary | ICD-10-CM | POA: Diagnosis not present

## 2024-01-16 ENCOUNTER — Inpatient Hospital Stay (HOSPITAL_COMMUNITY)
Admission: AD | Admit: 2024-01-16 | Discharge: 2024-01-17 | Disposition: A | Discharge status: Home / Self Care | Attending: Obstetrics & Gynecology | Admitting: Obstetrics & Gynecology

## 2024-01-16 ENCOUNTER — Other Ambulatory Visit: Payer: Self-pay

## 2024-01-16 ENCOUNTER — Encounter (HOSPITAL_COMMUNITY): Payer: Self-pay | Admitting: Family Medicine

## 2024-01-16 DIAGNOSIS — O4703 False labor before 37 completed weeks of gestation, third trimester: Secondary | ICD-10-CM | POA: Diagnosis not present

## 2024-01-16 DIAGNOSIS — Z3A33 33 weeks gestation of pregnancy: Secondary | ICD-10-CM | POA: Insufficient documentation

## 2024-01-16 LAB — URINALYSIS, ROUTINE W REFLEX MICROSCOPIC
Bilirubin Urine: NEGATIVE
Glucose, UA: NEGATIVE mg/dL
Hgb urine dipstick: NEGATIVE
Ketones, ur: NEGATIVE mg/dL
Nitrite: NEGATIVE
Protein, ur: NEGATIVE mg/dL
Specific Gravity, Urine: 1.008 (ref 1.005–1.030)
pH: 6 (ref 5.0–8.0)

## 2024-01-16 LAB — FETAL FIBRONECTIN: Fetal Fibronectin: POSITIVE — AB

## 2024-01-16 MED ORDER — ACETAMINOPHEN 325 MG PO TABS
ORAL_TABLET | ORAL | Status: AC
  Filled 2024-01-16: qty 2

## 2024-01-16 MED ORDER — NIFEDIPINE 10 MG PO CAPS
10.0000 mg | ORAL_CAPSULE | ORAL | Status: AC | PRN
  Administered 2024-01-16 – 2024-01-17 (×4): 10 mg via ORAL
  Filled 2024-01-16 (×4): qty 1

## 2024-01-16 MED ORDER — ACETAMINOPHEN 325 MG PO TABS
650.0000 mg | ORAL_TABLET | Freq: Once | ORAL | Status: AC
  Administered 2024-01-16: 650 mg via ORAL

## 2024-01-16 NOTE — MAU Note (Signed)
 MAU Triage Note  .Stephanie Santos is a 33 y.o. at [redacted]w[redacted]d here in MAU reporting ctxs since Sunday. She saw her provider yesterday and was started on Procardia. She took a dose at 1850 which hasn't helped. Reports good FM and denies LOF or VB. Cervix closed on Monday  LMP: n/a Onset of complaint: Sunday Pain score: 8 Vitals:   01/16/24 2214 01/16/24 2216  BP:  123/76  Pulse: 94   Resp: 18   Temp: 98 F (36.7 C)   SpO2: 100%      FHT: 145  Lab orders placed from triage: u/a

## 2024-01-16 NOTE — MAU Provider Note (Signed)
 Chief Complaint:  Contractions   Event Date/Time   First Provider Initiated Contact with Patient 01/16/24 2300     HPI: Stephanie Santos is a 33 y.o. G1P0000 at 25w3dwho presents to maternity admissions reporting preterm contractions.  Has been followed for this in office and was given Procardia  which did not help tonight.  . She reports good fetal movement, denies LOF, vaginal bleeding, urinary symptoms, n/v or fever/chills.   Abdominal Pain This is a recurrent problem. The quality of the pain is cramping. Pertinent negatives include no constipation, diarrhea or dysuria. Treatments tried: Procardia .   RN Note: .Stephanie Santos is a 33 y.o. at [redacted]w[redacted]d here in MAU reporting ctxs since Sunday. She saw her provider yesterday and was started on Procardia . She took a dose at 1850 which hasn't helped. Reports good FM and denies LOF or VB. Cervix closed on Monday Onset of complaint: Sunday Pain score: 8  Past Medical History: Past Medical History:  Diagnosis Date   Anemia 07/01/2020   Asthma    Low serum calcium 07/01/2020   Morbid obesity (HCC) 12/08/2020   Obesity (BMI 30-39.9) 06/30/2020   Prediabetes 07/01/2020    Past obstetric history: OB History  Gravida Para Term Preterm AB Living  1 0 0 0 0 0  SAB IAB Ectopic Multiple Live Births  0 0 0 0 0    # Outcome Date GA Lbr Len/2nd Weight Sex Type Anes PTL Lv  1 Current             Past Surgical History: History reviewed. No pertinent surgical history.  Family History: Family History  Problem Relation Age of Onset   Pancreatic cancer Mother 21   Ovarian cancer Maternal Grandmother    Diabetes Maternal Grandmother    Cancer Maternal Grandmother        OVARIAN     Social History: Social History   Tobacco Use   Smoking status: Never    Passive exposure: Past   Smokeless tobacco: Never  Vaping Use   Vaping status: Never Used  Substance Use Topics   Alcohol use: Not Currently    Comment: RARE   Drug use: Never     Allergies: No Known Allergies  Meds:  Medications Prior to Admission  Medication Sig Dispense Refill Last Dose/Taking   NIFEdipine  (PROCARDIA ) 10 MG capsule Take 10 mg by mouth 3 (three) times daily.   Taking   cyclobenzaprine  (FLEXERIL ) 10 MG tablet Take 1 tablet (10 mg total) by mouth 2 (two) times daily as needed. 30 tablet 0 More than a month    I have reviewed patient's Past Medical Hx, Surgical Hx, Family Hx, Social Hx, medications and allergies.   ROS:  Review of Systems  Gastrointestinal:  Positive for abdominal pain. Negative for constipation and diarrhea.  Genitourinary:  Negative for dysuria.   Other systems negative  Physical Exam  Patient Vitals for the past 24 hrs:  BP Temp Pulse Resp SpO2 Height Weight  01/16/24 2216 123/76 -- -- -- -- -- --  01/16/24 2214 -- 98 F (36.7 C) 94 18 100 % 5\' 5"  (1.651 m) 118.4 kg   Constitutional: Well-developed, well-nourished female in no acute distress.  Cardiovascular: normal rate  Respiratory: normal effort GI: Abd soft, non-tender, gravid appropriate for gestational age.   No rebound or guarding. MS: Extremities nontender, no edema, normal ROM Neurologic: Alert and oriented x 4.   PELVIC EXAM:  Dilation: Fingertip Effacement (%): 40 Station: Ballotable Exam by:: Broadus Canes, CNM  FHT:  Baseline 140 , moderate variability, accelerations present, no decelerations Contractions: q 4-5 mins Irregular     Labs: Results for orders placed or performed during the hospital encounter of 01/16/24 (from the past 24 hours)  Urinalysis, Routine w reflex microscopic -Urine, Clean Catch     Status: Abnormal   Collection Time: 01/16/24 10:30 PM  Result Value Ref Range   Color, Urine YELLOW YELLOW   APPearance HAZY (A) CLEAR   Specific Gravity, Urine 1.008 1.005 - 1.030   pH 6.0 5.0 - 8.0   Glucose, UA NEGATIVE NEGATIVE mg/dL   Hgb urine dipstick NEGATIVE NEGATIVE   Bilirubin Urine NEGATIVE NEGATIVE   Ketones, ur NEGATIVE  NEGATIVE mg/dL   Protein, ur NEGATIVE NEGATIVE mg/dL   Nitrite NEGATIVE NEGATIVE   Leukocytes,Ua SMALL (A) NEGATIVE   RBC / HPF 0-5 0 - 5 RBC/hpf   WBC, UA 0-5 0 - 5 WBC/hpf   Bacteria, UA RARE (A) NONE SEEN   Squamous Epithelial / HPF 0-5 0 - 5 /HPF  Fetal fibronectin     Status: Abnormal   Collection Time: 01/16/24 11:01 PM  Result Value Ref Range   Fetal Fibronectin POSITIVE (A) NEGATIVE   Imaging:  No results found.  MAU Course/MDM: I have reviewed the triage vital signs and the nursing notes.   Pertinent labs & imaging results that were available during my care of the patient were reviewed by me and considered in my medical decision making (see chart for details).      I have reviewed her medical records including past results, notes and treatments.   I have ordered labs and reviewed results.  NST reviewed Consult Dr Elisha Guillaume with presentation, exam findings and test results.  Treatments in MAU included Procardia  series which did finally stop contractions.  Discussed option of Betamethasone, given that she is close to 34weeks, optional per Dr Elisha Guillaume. Patient is undecided. .    Assessment: Single IUP at [redacted]w[redacted]d Preterm Labor  Plan: Discharge home Preterm Labor precautions and fetal kick counts Follow up in Office for prenatal visits and recheck Encouraged to return if she develops worsening of symptoms, increase in pain, fever, or other concerning symptoms.   Pt stable at time of discharge.  Holmes Lusher CNM, MSN Certified Nurse-Midwife 01/16/2024 11:00 PM

## 2024-01-17 DIAGNOSIS — Z3A33 33 weeks gestation of pregnancy: Secondary | ICD-10-CM | POA: Diagnosis not present

## 2024-01-23 DIAGNOSIS — Z23 Encounter for immunization: Secondary | ICD-10-CM | POA: Diagnosis not present

## 2024-02-06 DIAGNOSIS — Z3490 Encounter for supervision of normal pregnancy, unspecified, unspecified trimester: Secondary | ICD-10-CM | POA: Diagnosis not present

## 2024-02-06 LAB — OB RESULTS CONSOLE GBS: GBS: POSITIVE

## 2024-02-12 DIAGNOSIS — O3413 Maternal care for benign tumor of corpus uteri, third trimester: Secondary | ICD-10-CM | POA: Diagnosis not present

## 2024-02-13 ENCOUNTER — Other Ambulatory Visit: Payer: Self-pay | Admitting: Family Medicine

## 2024-02-13 DIAGNOSIS — D259 Leiomyoma of uterus, unspecified: Secondary | ICD-10-CM

## 2024-02-13 DIAGNOSIS — O365990 Maternal care for other known or suspected poor fetal growth, unspecified trimester, not applicable or unspecified: Secondary | ICD-10-CM

## 2024-02-17 DIAGNOSIS — O26873 Cervical shortening, third trimester: Secondary | ICD-10-CM | POA: Insufficient documentation

## 2024-02-19 ENCOUNTER — Ambulatory Visit: Attending: Maternal & Fetal Medicine

## 2024-02-19 ENCOUNTER — Ambulatory Visit: Admitting: Maternal & Fetal Medicine

## 2024-02-19 ENCOUNTER — Other Ambulatory Visit: Payer: Self-pay | Admitting: Family Medicine

## 2024-02-19 ENCOUNTER — Other Ambulatory Visit: Payer: Self-pay

## 2024-02-19 ENCOUNTER — Encounter: Payer: Self-pay | Admitting: Maternal & Fetal Medicine

## 2024-02-19 DIAGNOSIS — O365990 Maternal care for other known or suspected poor fetal growth, unspecified trimester, not applicable or unspecified: Secondary | ICD-10-CM

## 2024-02-19 DIAGNOSIS — O99213 Obesity complicating pregnancy, third trimester: Secondary | ICD-10-CM | POA: Diagnosis not present

## 2024-02-19 DIAGNOSIS — Z363 Encounter for antenatal screening for malformations: Secondary | ICD-10-CM | POA: Diagnosis not present

## 2024-02-19 DIAGNOSIS — Z3A38 38 weeks gestation of pregnancy: Secondary | ICD-10-CM | POA: Diagnosis not present

## 2024-02-19 DIAGNOSIS — O365930 Maternal care for other known or suspected poor fetal growth, third trimester, not applicable or unspecified: Secondary | ICD-10-CM

## 2024-02-19 DIAGNOSIS — D259 Leiomyoma of uterus, unspecified: Secondary | ICD-10-CM | POA: Diagnosis not present

## 2024-02-19 DIAGNOSIS — O3413 Maternal care for benign tumor of corpus uteri, third trimester: Secondary | ICD-10-CM | POA: Diagnosis not present

## 2024-02-19 DIAGNOSIS — D508 Other iron deficiency anemias: Secondary | ICD-10-CM

## 2024-02-19 DIAGNOSIS — E669 Obesity, unspecified: Secondary | ICD-10-CM

## 2024-02-19 NOTE — Progress Notes (Signed)
   Patient information  Patient Name: Stephanie Santos  Patient MRN:   [id number]  Referring practice: [facility] Referring Provider: Maire Scot  Problem List   Patient Active Problem List   Diagnosis Date Noted   Cervical shortening in third trimester 02/17/2024   Severe obesity (BMI >= 40) (HCC) 11/23/2021   Prediabetes 07/01/2020   Anemia 07/01/2020   Asthma    Maternal Fetal medicine Consult  Stephanie Santos is a 33 y.o. G1P0000 at [redacted]w[redacted]d here for ultrasound and consultation. Stephanie Santos is doing well today with no acute concerns. Today we focused on the following:   Concern for fetal growth restriction The patient was sent for concern for fetal growth restriction.  On today's ultrasound the estimated fetal weight is in the 38th percentile.  The femur lengths are the main component of the biometry C increasing estimated fetal weight.  However, still within the normal range constitutional variance.  The biophysical profile is 8 out of 8.  I recommend that she is delivery around 39 weeks or sooner if indicated.  If she goes past 39 weeks she will need antenatal testing due to her elevated BMI. The patient had time to ask questions that were answered to her satisfaction.  She verbalized understanding and agrees to proceed with the plan below.  Sonographic findings Single intrauterine pregnancy. Fetal cardiac activity: Observed. Presentation: Cephalic. Interval fetal anatomy appears normal. Fetal biometry shows the estimated fetal weight at the 30 percentile. Amniotic fluid: Within normal limits.  MVP: 5.74 cm. Placenta: Anterior Left. BPP: 8/8.   There are limitations of prenatal ultrasound such as the inability to detect certain abnormalities due to poor visualization. Various factors such as fetal position, gestational age and maternal body habitus may increase the difficulty in visualizing the fetal anatomy.    Recommendations -Delivery around 39 weeks or sooner if indicated.  If she goes  past 39 weeks she will need antenatal testing due to her elevated BMI.  Review of Systems: A review of systems was performed and was negative except per HPI   Vitals and Physical Exam    02/19/2024    8:54 AM 01/17/2024    1:05 AM 01/16/2024   10:16 PM  Vitals with BMI  Systolic 102 120 409  Diastolic 57 75 76  Pulse 107 127   Sitting comfortably on the sonogram table Nonlabored breathing Normal rate and rhythm Abdomen is nontender  Past pregnancies OB History  Gravida Para Term Preterm AB Living  1 0 0 0 0 0  SAB IAB Ectopic Multiple Live Births  0 0 0 0 0    # Outcome Date GA Lbr Len/2nd Weight Sex Type Anes PTL Lv  1 Current              I spent 30 minutes reviewing the patients chart, including labs and images as well as counseling the patient about her medical conditions. Greater than 50% of the time was spent in direct face-to-face patient counseling.  Penney Bowling  MFM, [place]   02/19/2024  1:17 PM

## 2024-02-20 ENCOUNTER — Telehealth (HOSPITAL_COMMUNITY): Payer: Self-pay | Admitting: *Deleted

## 2024-02-20 NOTE — Telephone Encounter (Signed)
 Preadmission screen

## 2024-02-21 ENCOUNTER — Inpatient Hospital Stay (HOSPITAL_COMMUNITY): Admitting: Anesthesiology

## 2024-02-21 ENCOUNTER — Encounter (HOSPITAL_COMMUNITY): Payer: Self-pay

## 2024-02-21 ENCOUNTER — Encounter (HOSPITAL_COMMUNITY): Payer: Self-pay | Admitting: Obstetrics and Gynecology

## 2024-02-21 ENCOUNTER — Other Ambulatory Visit: Payer: Self-pay

## 2024-02-21 ENCOUNTER — Inpatient Hospital Stay (HOSPITAL_COMMUNITY)
Admission: AD | Admit: 2024-02-21 | Discharge: 2024-02-24 | DRG: 806 | Disposition: A | Discharge status: Home / Self Care | Attending: Obstetrics and Gynecology | Admitting: Obstetrics and Gynecology

## 2024-02-21 DIAGNOSIS — E66813 Obesity, class 3: Secondary | ICD-10-CM | POA: Diagnosis not present

## 2024-02-21 DIAGNOSIS — O99824 Streptococcus B carrier state complicating childbirth: Secondary | ICD-10-CM | POA: Diagnosis not present

## 2024-02-21 DIAGNOSIS — Z452 Encounter for adjustment and management of vascular access device: Secondary | ICD-10-CM | POA: Diagnosis not present

## 2024-02-21 DIAGNOSIS — Z3A39 39 weeks gestation of pregnancy: Secondary | ICD-10-CM | POA: Diagnosis not present

## 2024-02-21 DIAGNOSIS — Z3A38 38 weeks gestation of pregnancy: Secondary | ICD-10-CM | POA: Diagnosis not present

## 2024-02-21 DIAGNOSIS — O99214 Obesity complicating childbirth: Secondary | ICD-10-CM | POA: Diagnosis present

## 2024-02-21 DIAGNOSIS — O411290 Chorioamnionitis, unspecified trimester, not applicable or unspecified: Secondary | ICD-10-CM | POA: Diagnosis not present

## 2024-02-21 DIAGNOSIS — O9081 Anemia of the puerperium: Secondary | ICD-10-CM | POA: Diagnosis not present

## 2024-02-21 DIAGNOSIS — O365990 Maternal care for other known or suspected poor fetal growth, unspecified trimester, not applicable or unspecified: Secondary | ICD-10-CM | POA: Diagnosis not present

## 2024-02-21 DIAGNOSIS — D259 Leiomyoma of uterus, unspecified: Secondary | ICD-10-CM | POA: Diagnosis present

## 2024-02-21 DIAGNOSIS — D62 Acute posthemorrhagic anemia: Secondary | ICD-10-CM | POA: Diagnosis not present

## 2024-02-21 DIAGNOSIS — O411490 Placentitis, unspecified trimester, not applicable or unspecified: Secondary | ICD-10-CM | POA: Diagnosis not present

## 2024-02-21 DIAGNOSIS — Z4682 Encounter for fitting and adjustment of non-vascular catheter: Secondary | ICD-10-CM | POA: Diagnosis not present

## 2024-02-21 DIAGNOSIS — O35EXX0 Maternal care for other (suspected) fetal abnormality and damage, fetal genitourinary anomalies, not applicable or unspecified: Secondary | ICD-10-CM | POA: Diagnosis present

## 2024-02-21 DIAGNOSIS — O26893 Other specified pregnancy related conditions, third trimester: Secondary | ICD-10-CM | POA: Diagnosis not present

## 2024-02-21 DIAGNOSIS — O3413 Maternal care for benign tumor of corpus uteri, third trimester: Secondary | ICD-10-CM | POA: Diagnosis not present

## 2024-02-21 DIAGNOSIS — Z833 Family history of diabetes mellitus: Secondary | ICD-10-CM | POA: Diagnosis not present

## 2024-02-21 LAB — CBC
HCT: 35.4 % — ABNORMAL LOW (ref 36.0–46.0)
HCT: 32.2 % — ABNORMAL LOW (ref 36.0–46.0)
HCT: 26.6 % — ABNORMAL LOW (ref 36.0–46.0)
Hemoglobin: 10.8 g/dL — ABNORMAL LOW (ref 12.0–15.0)
Hemoglobin: 9.9 g/dL — ABNORMAL LOW (ref 12.0–15.0)
Hemoglobin: 8.2 g/dL — ABNORMAL LOW (ref 12.0–15.0)
MCH: 20.4 pg — ABNORMAL LOW (ref 26.0–34.0)
MCH: 20.8 pg — ABNORMAL LOW (ref 26.0–34.0)
MCH: 20.9 pg — ABNORMAL LOW (ref 26.0–34.0)
MCHC: 30.5 g/dL (ref 30.0–36.0)
MCHC: 30.7 g/dL (ref 30.0–36.0)
MCHC: 30.8 g/dL (ref 30.0–36.0)
MCV: 66.9 fL — ABNORMAL LOW (ref 80.0–100.0)
MCV: 67.5 fL — ABNORMAL LOW (ref 80.0–100.0)
MCV: 67.7 fL — ABNORMAL LOW (ref 80.0–100.0)
Platelets: 145 10*3/uL — ABNORMAL LOW (ref 150–400)
Platelets: 159 10*3/uL (ref 150–400)
Platelets: 147 10*3/uL — ABNORMAL LOW (ref 150–400)
RBC: 5.29 MIL/uL — ABNORMAL HIGH (ref 3.87–5.11)
RBC: 4.77 MIL/uL (ref 3.87–5.11)
RBC: 3.93 MIL/uL (ref 3.87–5.11)
RDW: 19.4 % — ABNORMAL HIGH (ref 11.5–15.5)
RDW: 19.1 % — ABNORMAL HIGH (ref 11.5–15.5)
RDW: 18.8 % — ABNORMAL HIGH (ref 11.5–15.5)
WBC: 14.2 10*3/uL — ABNORMAL HIGH (ref 4.0–10.5)
WBC: 17.8 10*3/uL — ABNORMAL HIGH (ref 4.0–10.5)
WBC: 21.3 10*3/uL — ABNORMAL HIGH (ref 4.0–10.5)
nRBC: 0 % (ref 0.0–0.2)
nRBC: 0.1 % (ref 0.0–0.2)
nRBC: 0.1 % (ref 0.0–0.2)

## 2024-02-21 LAB — DIC (DISSEMINATED INTRAVASCULAR COAGULATION)PANEL
D-Dimer, Quant: 1.98 ug{FEU}/mL — ABNORMAL HIGH (ref 0.00–0.50)
Fibrinogen: 575 mg/dL — ABNORMAL HIGH (ref 210–475)
INR: 1.2 (ref 0.8–1.2)
Platelets: 165 10*3/uL (ref 150–400)
Prothrombin Time: 15.5 s — ABNORMAL HIGH (ref 11.4–15.2)
Smear Review: NONE SEEN
aPTT: 27 s (ref 24–36)

## 2024-02-21 LAB — RPR: RPR Ser Ql: NONREACTIVE

## 2024-02-21 MED ORDER — LACTATED RINGERS IV SOLN
500.0000 mL | Freq: Once | INTRAVENOUS | Status: AC
  Administered 2024-02-21: 500 mL via INTRAVENOUS

## 2024-02-21 MED ORDER — EPHEDRINE 5 MG/ML INJ: 10.0000 mg | INTRAVENOUS | Status: DC | PRN

## 2024-02-21 MED ORDER — SOD CITRATE-CITRIC ACID 500-334 MG/5ML PO SOLN: 30.0000 mL | ORAL | Status: DC | PRN

## 2024-02-21 MED ORDER — FERROUS SULFATE 325 (65 FE) MG PO TABS: 325.0000 mg | ORAL_TABLET | Freq: Every day | ORAL | Status: DC

## 2024-02-21 MED ORDER — SODIUM CHLORIDE 0.9 % IV SOLN
5.0000 10*6.[IU] | Freq: Once | INTRAVENOUS | Status: AC
  Administered 2024-02-21: 5 10*6.[IU] via INTRAVENOUS
  Filled 2024-02-21: qty 5

## 2024-02-21 MED ORDER — BENZOCAINE-MENTHOL 20-0.5 % EX AERO
1.0000 | INHALATION_SPRAY | CUTANEOUS | Status: DC | PRN
  Administered 2024-02-21: 1 via TOPICAL
  Filled 2024-02-21: qty 56

## 2024-02-21 MED ORDER — ONDANSETRON HCL 4 MG/2ML IJ SOLN: 4.0000 mg | INTRAMUSCULAR | Status: DC | PRN

## 2024-02-21 MED ORDER — ZOLPIDEM TARTRATE 5 MG PO TABS
5.0000 mg | ORAL_TABLET | Freq: Every evening | ORAL | Status: AC | PRN
Start: 2024-02-21 — End: ?

## 2024-02-21 MED ORDER — PRENATAL MULTIVITAMIN CH
1.0000 | ORAL_TABLET | Freq: Every day | ORAL | Status: DC
  Administered 2024-02-22 – 2024-02-24 (×3): 1 via ORAL
  Filled 2024-02-21 (×3): qty 1

## 2024-02-21 MED ORDER — PHENYLEPHRINE 80 MCG/ML (10ML) SYRINGE FOR IV PUSH (FOR BLOOD PRESSURE SUPPORT): 80.0000 ug | PREFILLED_SYRINGE | INTRAVENOUS | Status: DC | PRN

## 2024-02-21 MED ORDER — FENTANYL-BUPIVACAINE-NACL 0.5-0.125-0.9 MG/250ML-% EP SOLN
12.0000 mL/h | EPIDURAL | Status: DC | PRN
  Administered 2024-02-21: 12 mL/h via EPIDURAL
  Filled 2024-02-21: qty 250

## 2024-02-21 MED ORDER — SENNOSIDES-DOCUSATE SODIUM 8.6-50 MG PO TABS
2.0000 | ORAL_TABLET | Freq: Every day | ORAL | Status: AC
Start: 2024-02-22 — End: ?
  Administered 2024-02-22 – 2024-02-24 (×3): 2 via ORAL
  Filled 2024-02-21 (×3): qty 2

## 2024-02-21 MED ORDER — COCONUT OIL OIL
1.0000 | TOPICAL_OIL | Status: DC | PRN
  Administered 2024-02-22: 1 via TOPICAL

## 2024-02-21 MED ORDER — DIBUCAINE (PERIANAL) 1 % EX OINT: 1.0000 | TOPICAL_OINTMENT | CUTANEOUS | Status: DC | PRN

## 2024-02-21 MED ORDER — OXYCODONE-ACETAMINOPHEN 5-325 MG PO TABS: 2.0000 | ORAL_TABLET | ORAL | Status: DC | PRN

## 2024-02-21 MED ORDER — MISOPROSTOL 200 MCG PO TABS
ORAL_TABLET | ORAL | Status: AC
  Filled 2024-02-21: qty 4

## 2024-02-21 MED ORDER — ONDANSETRON HCL 4 MG/2ML IJ SOLN
4.0000 mg | Freq: Four times a day (QID) | INTRAMUSCULAR | Status: DC | PRN
  Filled 2024-02-21: qty 2

## 2024-02-21 MED ORDER — ONDANSETRON HCL 4 MG PO TABS: 4.0000 mg | ORAL_TABLET | ORAL | Status: DC | PRN

## 2024-02-21 MED ORDER — WITCH HAZEL-GLYCERIN EX PADS: 1.0000 | MEDICATED_PAD | CUTANEOUS | Status: DC | PRN

## 2024-02-21 MED ORDER — PENICILLIN G POT IN DEXTROSE 60000 UNIT/ML IV SOLN
3.0000 10*6.[IU] | INTRAVENOUS | Status: DC
  Administered 2024-02-21: 3 10*6.[IU] via INTRAVENOUS
  Filled 2024-02-21: qty 50

## 2024-02-21 MED ORDER — OXYTOCIN-SODIUM CHLORIDE 30-0.9 UT/500ML-% IV SOLN
2.5000 [IU]/h | INTRAVENOUS | Status: DC
  Filled 2024-02-21: qty 500

## 2024-02-21 MED ORDER — ACETAMINOPHEN 325 MG PO TABS: 650.0000 mg | ORAL_TABLET | ORAL | Status: DC | PRN

## 2024-02-21 MED ORDER — OXYTOCIN BOLUS FROM INFUSION: 333.0000 mL | Freq: Once | INTRAVENOUS | Status: DC

## 2024-02-21 MED ORDER — LIDOCAINE HCL (PF) 1 % IJ SOLN
30.0000 mL | INTRAMUSCULAR | Status: DC | PRN
Start: 2024-02-21 — End: 2024-02-21

## 2024-02-21 MED ORDER — LACTATED RINGERS IV SOLN: 500.0000 mL | INTRAVENOUS | Status: DC | PRN

## 2024-02-21 MED ORDER — OXYCODONE-ACETAMINOPHEN 5-325 MG PO TABS: 1.0000 | ORAL_TABLET | ORAL | Status: DC | PRN

## 2024-02-21 MED ORDER — IBUPROFEN 600 MG PO TABS
600.0000 mg | ORAL_TABLET | Freq: Four times a day (QID) | ORAL | Status: AC
Start: 2024-02-21 — End: ?
  Administered 2024-02-21 – 2024-02-24 (×10): 600 mg via ORAL
  Filled 2024-02-21 (×13): qty 1

## 2024-02-21 MED ORDER — LACTATED RINGERS IV SOLN
INTRAVENOUS | Status: DC
  Administered 2024-02-21: 1000 mL via INTRAVENOUS

## 2024-02-21 MED ORDER — FENTANYL CITRATE (PF) 100 MCG/2ML IJ SOLN
50.0000 ug | INTRAMUSCULAR | Status: DC | PRN
  Administered 2024-02-21: 100 ug via INTRAVENOUS
  Filled 2024-02-21: qty 2

## 2024-02-21 MED ORDER — TRANEXAMIC ACID-NACL 1000-0.7 MG/100ML-% IV SOLN: 1000.0000 mg | INTRAVENOUS | Status: AC

## 2024-02-21 MED ORDER — SIMETHICONE 80 MG PO CHEW: 80.0000 mg | CHEWABLE_TABLET | ORAL | Status: DC | PRN

## 2024-02-21 MED ORDER — DIPHENHYDRAMINE HCL 25 MG PO CAPS: 25.0000 mg | ORAL_CAPSULE | Freq: Four times a day (QID) | ORAL | Status: DC | PRN

## 2024-02-21 MED ORDER — BUPIVACAINE HCL (PF) 0.25 % IJ SOLN: INTRAMUSCULAR | Status: DC | PRN

## 2024-02-21 MED ORDER — TRANEXAMIC ACID-NACL 1000-0.7 MG/100ML-% IV SOLN
INTRAVENOUS | Status: AC
  Administered 2024-02-21: 1000 mg via INTRAVENOUS
  Filled 2024-02-21: qty 100

## 2024-02-21 MED ORDER — DIPHENHYDRAMINE HCL 50 MG/ML IJ SOLN: 12.5000 mg | INTRAMUSCULAR | Status: DC | PRN

## 2024-02-21 MED ORDER — HYDROXYZINE HCL 50 MG PO TABS: 50.0000 mg | ORAL_TABLET | Freq: Four times a day (QID) | ORAL | Status: DC | PRN

## 2024-02-21 MED ORDER — TETANUS-DIPHTH-ACELL PERTUSSIS 5-2.5-18.5 LF-MCG/0.5 IM SUSY: 0.5000 mL | PREFILLED_SYRINGE | Freq: Once | INTRAMUSCULAR | Status: DC

## 2024-02-21 MED ORDER — LIDOCAINE HCL (PF) 1 % IJ SOLN
INTRAMUSCULAR | Status: DC | PRN
Start: 2024-02-21 — End: 2024-02-21
  Administered 2024-02-21 (×2): 4 mL via EPIDURAL

## 2024-02-21 MED ORDER — MISOPROSTOL 200 MCG PO TABS
800.0000 ug | ORAL_TABLET | Freq: Once | ORAL | Status: AC
  Administered 2024-02-21: 800 ug via RECTAL

## 2024-02-21 MED ORDER — ACETAMINOPHEN 325 MG PO TABS
650.0000 mg | ORAL_TABLET | ORAL | Status: DC | PRN
  Administered 2024-02-21: 650 mg via ORAL
  Filled 2024-02-21: qty 2

## 2024-02-21 NOTE — MAU Note (Signed)
 [name] is a 33 y.o. at [redacted]w[redacted]d here in MAU reporting: ctx since 1900 - now back to back. Denies VB or LOF. +FM. 1cm earlier today in the office.   LMP: NA Onset of complaint: 1900 Pain score: 10 Vitals:   02/21/24 0121  BP: 126/66  Pulse: 99  Resp: 20  Temp: 98.1 F (36.7 C)  SpO2: 100%     FHT: 145  Lab orders placed from triage: labor eval

## 2024-02-21 NOTE — Progress Notes (Signed)
 POSTPARTUM PROGRESS NOTE  Post Partum Day 1  Subjective:  Stephanie Santos is a 33 y.o. G1P0000 s/p VD at [redacted]w[redacted]d.  She reports she is doing well. No acute events overnight. She denies any problems with ambulating, voiding or po intake. Denies nausea or vomiting. She also denies SOB, dizziness, and lightheadedness.  Pain is well controlled.  Lochia is appropriate.  Objective: Blood pressure (!) 101/59, pulse 94, temperature 97.9 F (36.6 C), temperature source Oral, resp. rate 19, height 5' 5 (1.651 m), weight 117 kg, last menstrual period 05/27/2023, SpO2 100%.  Physical Exam:  General: alert, cooperative and no distress Chest: no respiratory distress Heart:regular rate, distal pulses intact Abdomen: soft, nontender,  Uterine Fundus: firm, appropriately tender DVT Evaluation: No calf swelling or tenderness Extremities: No LE edema Skin: warm, dry  Recent Labs    02/21/24 1906 02/22/24 0447  HGB 8.2* 7.2*  HCT 26.6* 23.7*    Assessment/Plan: Stephanie Santos is a 59 y.o. G1P0000 s/p VD at [redacted]w[redacted]d   PPD#1 - Doing well  Routine postpartum care  Anemia 2/2 PPH Asymptomatic -Increase oral iron to BID -AM CBC  Feeding: NICU Dispo: Plan for discharge 6/15.   LOS: 1 day   [employer], DO 02/22/2024, 6:29 AM

## 2024-02-21 NOTE — Lactation Note (Signed)
 This note was copied from a baby's chart. Lactation Consultation Note  Patient Name: Stephanie Santos [id number] Date: 02/21/2024 Age:34 hours Reason for consult: Initial assessment;Primapara;1st time breastfeeding;NICU baby;Early term 37-38.6wks  P1- Infant is admitted to the NICU for respiratory distress and suspected T21. MOB is admitted to the [facility] 5th floor. MOB's RN had previously set her up with the hospital DEBP and size 27 mm flanges. MOB's RN also provided her with a hands free pumping top. LC sized MOB at a 24 mm flange for the left nipple and a 21 mm flange for the right nipple. LC reviewed how to use the pump and how to clean it. MOB agreed to pumping at this time because she wanted to go to bed. LC watched MOB pump for 5 minutes as I reviewed general breastfeeding education. Within the 5 minutes, a small drop of colostrum was noted in both flanges. LC reviewed the importance of pumping every 3 hrs for proper breast stimulation. LC reviewed CDC milk storage guidelines, LC services and engorgement/breast care. LC encouraged MOB to call for further assistance as needed.  Maternal Data Does the patient have breastfeeding experience prior to this delivery?: No  Feeding Mother's Current Feeding Choice: Breast Milk  Lactation Tools Discussed/Used Tools: Pump;Flanges Flange Size: 21;24 Breast pump type: Double-Electric Breast Pump;Manual Pump Education: Setup, frequency, and cleaning;Milk Storage Reason for Pumping: Infant in NICU Pumping frequency: 15-20 min every 3 hrs  Interventions Interventions: Breast feeding basics reviewed;Hand pump;DEBP;Education;LC Services brochure  Discharge Discharge Education: Engorgement and breast care;Warning signs for feeding baby Pump: DEBP;Personal  Consult Status Consult Status: Follow-up Date: 02/22/24 Follow-up type: In-patient    Vernette Goo 02/21/2024, 10:20 PM

## 2024-02-21 NOTE — Anesthesia Preprocedure Evaluation (Signed)
 Anesthesia Evaluation  Patient identified by MRN, date of birth, ID band Patient awake    Reviewed: Allergy & Precautions, NPO status , Patient's Chart, lab work & pertinent test results  History of Anesthesia Complications Negative for: history of anesthetic complications  Airway Mallampati: III  TM Distance: >3 FB Neck ROM: Full    Dental   Pulmonary neg shortness of breath, asthma , neg sleep apnea, neg COPD, neg recent URI   Pulmonary exam normal breath sounds clear to auscultation       Cardiovascular negative cardio ROS  Rhythm:Regular Rate:Normal     Neuro/Psych negative neurological ROS     GI/Hepatic negative GI ROS, Neg liver ROS,,,  Endo/Other    Class 3 obesityPre-diabetes  Renal/GU negative Renal ROS     Musculoskeletal   Abdominal  (+) + obese  Peds  Hematology  (+) Blood dyscrasia, anemia Lab Results      Component                Value               Date                      WBC                      14.2 (H)            02/21/2024                HGB                      10.8 (L)            02/21/2024                HCT                      35.4 (L)            02/21/2024                MCV                      66.9 (L)            02/21/2024                PLT                      145 (L)             02/21/2024              Anesthesia Other Findings   Reproductive/Obstetrics (+) Pregnancy                             Anesthesia Physical Anesthesia Plan  ASA: 3  Anesthesia Plan: Epidural   Post-op Pain Management:    Induction:   PONV Risk Score and Plan:   Airway Management Planned: Natural Airway  Additional Equipment:   Intra-op Plan:   Post-operative Plan:   Informed Consent: I have reviewed the patients History and Physical, chart, labs and discussed the procedure including the risks, benefits and alternatives for the proposed anesthesia with the patient  or authorized representative who has indicated his/her understanding and acceptance.       Plan Discussed with: Anesthesiologist  Anesthesia Plan Comments: (I have discussed risks of neuraxial anesthesia including but not limited to infection, bleeding, nerve injury, back pain, headache, seizures, and failure of block. Patient denies bleeding disorders and is not currently anticoagulated. Labs have been reviewed. Risks and benefits discussed. All patient's questions answered.  )       Anesthesia Quick Evaluation

## 2024-02-21 NOTE — MAU Note (Signed)

## 2024-02-21 NOTE — Lactation Note (Signed)
 This note was copied from a baby's chart. Lactation Consultation Note  Patient Name: Stephanie Santos Date: 02/21/2024 Age:34 hours   Attempted to visit with family but Ms. Dedominicis was asleep. She got moved out of couplet care to the [facility] because baby [name] had to be intubated. Couplet care RN Rosabel Collet had already set her up with a DEBP and she has already started pumping. LC services to F/U at a later time for initial assessment.   Treyvonne Tata S Ashlynd Michna 02/21/2024, 7:15 PM

## 2024-02-21 NOTE — Progress Notes (Signed)
 Labor Progress Note Stephanie Santos is a 33 y.o. G1P0000 at [redacted]w[redacted]d presented for SOL.   S: No acute concerns, comfortable with her epidural.   O:  BP 125/66 (BP Location: Right Arm)   Pulse (!) 102   Temp 97.7 F (36.5 C) (Oral)   Resp 18   Ht 5' 5 (1.651 m)   Wt 117 kg   LMP 05/27/2023 (Exact Date)   SpO2 97%   BMI 42.93 kg/m  EFM: 120bpm/moderate/+accels, no decels Toco: every 3-5 mins CVE: Dilation: 7 Effacement (%): 90 Station: -1 Presentation: Vertex Exam by:: autry lott   A&P: 33 y.o. G1P0000 [redacted]w[redacted]d here for SOL yesterday.  #Labor: Progressing well. S/p AROM with meconium stained fluid. Consider pitocin if contraction continue to increase in interval.  #Pain: Epidural #FWB: Cat I  #GBS positive, PCN ppx  Phillippa Straub Autry-Lott, DO 7:51 AM

## 2024-02-21 NOTE — Plan of Care (Signed)

## 2024-02-21 NOTE — H&P (Addendum)
 Stephanie Santos is a 33 y.o. female G1P0 presenting complaining of regular contractions for the last 6 hours. Cervix was 3.5/80/-3 in MAU. Yesterday in the office cervix was 1 cm. She Denies lof or vaginal bleeding. +FM. Pregnancy  complicated by Fibroids, morbid obesity and asthma.   Fibroids:  Right 8.5 cm... Anterior 4.5 cm... Fundal 4.4 cm.   Prenatal care provided by Dr. Arlee Lace with [facility].   U/S for efw with MFM on 02/19/2024 was 6 lbs 12 oz (30%ile) BPP 8/8 Presentation  Cephalic .Aaron Aas Placenta located anterior left.   OB History     Gravida  1   Para  0   Term  0   Preterm  0   AB  0   Living  0      SAB  0   IAB  0   Ectopic  0   Multiple  0   Live Births  0          Past Medical History:  Diagnosis Date   Anemia 07/01/2020   Asthma    Low serum calcium 07/01/2020   Morbid obesity (HCC) 12/08/2020   Obesity (BMI 30-39.9) 06/30/2020   Prediabetes 07/01/2020   Past Surgical History:  Procedure Laterality Date   NO PAST SURGERIES     Family History: family history includes Cancer in her maternal grandmother; Diabetes in her maternal grandmother; Ovarian cancer in her maternal grandmother; Pancreatic cancer (age of onset: 46) in her mother. Social History:  reports that she has never smoked. She has been exposed to tobacco smoke. She has never used smokeless tobacco. She reports that she does not currently use alcohol. She reports that she does not use drugs.     Maternal Diabetes: No Genetic Screening: Declined Maternal Ultrasounds/Referrals: Fetal renal pyelectasis Fetal Ultrasounds or other Referrals:  None Maternal Substance Abuse:  No Significant Maternal Medications:  None Significant Maternal Lab Results:  Group B Strep positive Number of Prenatal Visits:greater than 3 verified prenatal visits Maternal Vaccinations:TDap Other Comments:  None  Review of Systems  Constitutional: Negative.   HENT: Negative.    Eyes: Negative.    Respiratory: Negative.    Cardiovascular: Negative.   Gastrointestinal: Negative.   Endocrine: Negative.   Genitourinary: Negative.   Musculoskeletal: Negative.   Skin: Negative.   Allergic/Immunologic: Negative.   Neurological: Negative.   Hematological: Negative.   Psychiatric/Behavioral: Negative.     History Dilation: 3.5 Effacement (%): 80 Exam by:: Cathalene Clipper, RN Blood pressure 126/66, pulse 99, temperature 98.1 F (36.7 C), temperature source Oral, resp. rate 20, last menstrual period 05/27/2023, SpO2 100%. Maternal Exam:  Introitus: Normal vulva.   Physical Exam Vitals reviewed.  Constitutional:      Appearance: Normal appearance.  HENT:     Head: Normocephalic and atraumatic.     Nose: Nose normal.     Mouth/Throat:     Mouth: Mucous membranes are moist.   Eyes:     Pupils: Pupils are equal, round, and reactive to light.    Cardiovascular:     Rate and Rhythm: Normal rate and regular rhythm.     Pulses: Normal pulses.     Heart sounds: Normal heart sounds.  Pulmonary:     Effort: Pulmonary effort is normal.     Breath sounds: Normal breath sounds.  Abdominal:     Tenderness: There is no abdominal tenderness.  Genitourinary:    General: Normal vulva.     Comments: Cervix 4/90/-3 with bulging bag  Musculoskeletal:        General: Swelling present. Normal range of motion.     Cervical back: Normal range of motion and neck supple.   Skin:    General: Skin is warm.   Neurological:     General: No focal deficit present.     Mental Status: She is alert and oriented to person, place, and time.   Psychiatric:        Mood and Affect: Mood normal.        Behavior: Behavior normal.     Prenatal labs: ABO, Rh: --/--/PENDING (06/13 0153) Antibody: PENDING (06/13 0153) Rubella: Immune (11/14 0000) RPR: Nonreactive (11/14 0000)  HBsAg: Negative (11/14 0000)  HIV: Non-reactive (11/14 0000)  GBS:   Positive on 02/06/2024  Assessment/Plan: 38  weeks and 4 days in active labor  -Admit to labor and delivery  - Penicillin for GBS prophylaxis  - Pain control- patient planning epidural  -Fetal well being Category 1 and 2 overall reassuring  Anticipate SVD    Arlee Lace 02/21/2024, 2:47 AM

## 2024-02-21 NOTE — Anesthesia Procedure Notes (Signed)
 Epidural Patient location during procedure: OB Start time: 02/21/2024 3:30 AM End time: 02/21/2024 3:35 AM  Staffing Anesthesiologist: Conard Decent, MD Performed: anesthesiologist   Preanesthetic Checklist Completed: patient identified, IV checked, site marked, risks and benefits discussed, surgical consent, monitors and equipment checked, pre-op evaluation and timeout performed  Epidural Patient position: sitting Prep: DuraPrep and site prepped and draped Patient monitoring: continuous pulse ox and blood pressure Approach: midline Location: L3-L4 Injection technique: LOR saline  Needle:  Needle type: Tuohy  Needle gauge: 17 G Needle length: 9 cm and 9 Needle insertion depth: 8 cm Catheter type: closed end flexible Catheter size: 19 Gauge Catheter at skin depth: 13 cm Test dose: negative  Assessment Events: blood not aspirated, no cerebrospinal fluid, injection not painful, no injection resistance, no paresthesia and negative IV test  Additional Notes The patient has requested an epidural for labor pain management. Risks and benefits including, but not limited to, infection, bleeding, local anesthetic toxicity, headache, hypotension, back pain, block failure, etc. were discussed with the patient. The patient expressed understanding and consented to the procedure. I confirmed that the patient has no bleeding disorders and is not taking blood thinners. I confirmed the patient's last platelet count with the nurse. A time-out was performed immediately prior to the procedure. Please see nursing documentation for vital signs. Sterile technique was used throughout the whole procedure. Once LOR achieved, the epidural catheter threaded easily without resistance. Aspiration of the catheter was negative for blood and CSF. The epidural was dosed slowly and an infusion was started.  1 attempt(s)Reason for block:procedure for pain

## 2024-02-22 LAB — CBC
HCT: 23.7 % — ABNORMAL LOW (ref 36.0–46.0)
Hemoglobin: 7.2 g/dL — ABNORMAL LOW (ref 12.0–15.0)
MCH: 20.6 pg — ABNORMAL LOW (ref 26.0–34.0)
MCHC: 30.4 g/dL (ref 30.0–36.0)
MCV: 67.9 fL — ABNORMAL LOW (ref 80.0–100.0)
Platelets: 125 10*3/uL — ABNORMAL LOW (ref 150–400)
RBC: 3.49 MIL/uL — ABNORMAL LOW (ref 3.87–5.11)
RDW: 18.8 % — ABNORMAL HIGH (ref 11.5–15.5)
WBC: 18.5 10*3/uL — ABNORMAL HIGH (ref 4.0–10.5)
nRBC: 0.2 % (ref 0.0–0.2)

## 2024-02-22 MED ORDER — FERROUS SULFATE 325 (65 FE) MG PO TABS
325.0000 mg | ORAL_TABLET | Freq: Two times a day (BID) | ORAL | Status: DC
  Administered 2024-02-22 – 2024-02-24 (×5): 325 mg via ORAL
  Filled 2024-02-22 (×5): qty 1

## 2024-02-22 NOTE — Lactation Note (Addendum)
 This note was copied from a baby's chart.  NICU Lactation Consultation Note  Patient Name: Boy Joyanne Eddinger RUEAV'W Date: 02/22/2024 Age:34 hours  Reason for consult: Follow-up assessment; Primapara; 1st time breastfeeding; NICU baby; Early term 77-38.6wks  SUBJECTIVE Visited with family of 61 56/39 weeks old NICU female; baby [name] got admited due to respiratory distress and suspected trisomy 9. Ms. Delpriore has already initiated pumping but hasn't gotten any colostrum. Explained that the purpose of pumping this early on is mainly for breast stimulation and not to get volume, praised her for all her efforts. Reviewed pumping schedule, pumping log, lactogenesis II/III and anticipatory guidelines.   OBJECTIVE Infant data: Mother's Current Feeding Choice: Breast Milk and Donor Milk  O2 Device: Ventilator FiO2 (%): 21 %  Infant feeding assessment IDFTS - Readiness: 5   Maternal data: G1P0000 Vaginal, Spontaneous Has patient been taught Hand Expression?: Yes Hand Expression Comments: no colostrum noted at this time Significant Breast History:: moderate breast changes during the pregnancy Current breast feeding challenges:: NICU admission Does the patient have breastfeeding experience prior to this delivery?: No Pumping frequency: initiated pumping at 3-4 hours post-partum Pumped volume: 0 mL Flange Size: 21; 24 Hands-free pumping top sizes: X-Large Marrie Sizer) Risk factor for low/delayed milk supply:: primipara, prematurity, infant separation, suspected trisomy 21  Pump:  (Stork pump referral sent on 02/22/2024; no pump at home yet)  ASSESSMENT Infant: Feeding Status: Scheduled 8-11-2-5 Feeding method: Tube/Gavage (Bolus)  Maternal: Milk volume: Normal  INTERVENTIONS/PLAN Interventions: Interventions: Breast feeding basics reviewed; Breast massage; Hand express; Coconut oil; DEBP; Education; NICU Pumping Log Discharge Education: Engorgement and breast care; Warning signs for  feeding baby Tools: Hands-free pumping top; Coconut oil Pump Education: Setup, frequency, and cleaning; Milk Storage  Plan: STS once able to Massage and hand express both breasts prior/after pumping; coconut oil prior pumping Pump both breasts on initiate mode every 3 hours for 15 minutes; ideally 8 times/24 hours Verify Stork pump issuance  Grandparents present and supportive. All questions and concerns answered, family to contact [facility] services PRN.  Consult Status: NICU follow-up NICU Follow-up type: Maternal D/C visit   Lunell Robart Newman Bare 02/22/2024, 12:30 PM

## 2024-02-22 NOTE — Anesthesia Postprocedure Evaluation (Signed)
 Anesthesia Post Note  Patient: Stephanie Santos  Procedure(s) Performed: AN AD HOC LABOR EPIDURAL     Patient location during evaluation: Mother Baby Anesthesia Type: Epidural Level of consciousness: awake and alert Pain management: pain level controlled Vital Signs Assessment: post-procedure vital signs reviewed and stable Respiratory status: spontaneous breathing, nonlabored ventilation and respiratory function stable Cardiovascular status: stable Postop Assessment: no headache, no backache and epidural receding Anesthetic complications: no   No notable events documented.  Last Vitals:  Vitals:   02/21/24 2350 02/22/24 0322  BP: (!) 104/59 (!) 101/59  Pulse: 96 94  Resp: 18 19  Temp: 36.8 C 36.6 C  SpO2: 100% 100%    Last Pain:  Vitals:   02/22/24 0328  TempSrc:   PainSc: 3    Pain Goal:                   Loree Roe

## 2024-02-23 LAB — CBC
HCT: 21.9 % — ABNORMAL LOW (ref 36.0–46.0)
Hemoglobin: 6.7 g/dL — CL (ref 12.0–15.0)
MCH: 20.8 pg — ABNORMAL LOW (ref 26.0–34.0)
MCHC: 30.6 g/dL (ref 30.0–36.0)
MCV: 68 fL — ABNORMAL LOW (ref 80.0–100.0)
Platelets: 130 10*3/uL — ABNORMAL LOW (ref 150–400)
RBC: 3.22 MIL/uL — ABNORMAL LOW (ref 3.87–5.11)
RDW: 19.1 % — ABNORMAL HIGH (ref 11.5–15.5)
WBC: 14.4 10*3/uL — ABNORMAL HIGH (ref 4.0–10.5)
nRBC: 0.5 % — ABNORMAL HIGH (ref 0.0–0.2)

## 2024-02-23 LAB — HEMOGLOBIN AND HEMATOCRIT, BLOOD
HCT: 28.3 % — ABNORMAL LOW (ref 36.0–46.0)
Hemoglobin: 8.9 g/dL — ABNORMAL LOW (ref 12.0–15.0)

## 2024-02-23 LAB — PREPARE RBC (CROSSMATCH)

## 2024-02-23 LAB — ABO/RH: ABO/RH(D): O POS

## 2024-02-23 MED ORDER — FERROUS SULFATE 325 (65 FE) MG PO TABS: 325.0000 mg | ORAL_TABLET | Freq: Two times a day (BID) | ORAL | 0 refills | Status: AC

## 2024-02-23 MED ORDER — IBUPROFEN 600 MG PO TABS
600.0000 mg | ORAL_TABLET | Freq: Four times a day (QID) | ORAL | 0 refills | Status: AC
Start: 2024-02-23 — End: ?

## 2024-02-23 MED ORDER — SODIUM CHLORIDE 0.9% IV SOLUTION: INTRAVENOUS | Status: DC

## 2024-02-23 NOTE — Progress Notes (Signed)
 Postpartum Note Day #2  S:  Patient doing well.  Pain controlled.  Tolerating regular diet.  Ambulating and voiding without difficulty. Working on pumping in the NICU currently. Infant was intubated but is now on CPAP. She does not have any light-headedness, dizziness, chest pain, or SOB. Denies fevers, chills, chest pain, SOB, N/V, or worsening bilateral LE edema.  Lochia: Minimal Infant feeding:  Pumping, formula Circumcision:  Desires prior to infant's discharge when medically stable Contraception:  To be discussed at [facility]  O: Temp:  [97.9 F (36.6 C)-98.8 F (37.1 C)] 98.2 F (36.8 C) (06/15 0614) Pulse Rate:  [78-98] 98 (06/15 0614) Resp:  [18] 18 (06/15 0614) BP: (107-120)/(55-72) 120/72 (06/15 0614) SpO2:  [98 %-100 %] 100 % (06/15 0614) Gen: NAD, pleasant and cooperative CV: Regular rate Resp: Normal work of breathing Abdomen: soft, non-distended, non-tender throughout Uterus: firm, non-tender, below umbilicus Ext: No bilateral LE edema, no bilateral calf tenderness Labs:     Latest Ref Rng & Units 02/23/2024    5:11 AM 02/22/2024    4:47 AM 02/21/2024    7:06 PM  CBC  WBC 4.0 - 10.5 K/uL 14.4  18.5  21.3   Hemoglobin 12.0 - 15.0 g/dL 6.7  7.2  8.2   Hematocrit 36.0 - 46.0 % 21.9  23.7  26.6   Platelets 150 - 400 K/uL 130  125  147     A/P: Patient is a 33 y.o. G1P0000 PPD#2 s/p SVD.  S/p SVD - Pain well controlled  - GU: UOP is adequate - GI: Tolerating regular diet - Activity: encouraged sitting up to chair and ambulation as tolerated - DVT Prophylaxis: Ambulation, SCDs - Labs: as above  Acute blood loss anemia secondary to postpartum hemorrhage - EBL 1700 at time of delivery - Hgb 10.8 -->> 6.7  Hct 35.4 -->> 21.9 - Discussed risks/benefits in full of blood transfusion vs iron infusion - recommend blood and she accepts - 2u pRBCs ordered  Consents: Patient was consented for blood products.  The patient is aware that bleeding may result in the need for a  blood transfusion which includes risk of transmission of HIV (1:2 million), Hepatitis C (1:2 million), and Hepatitis B (1:200 thousand) and transfusion reaction.  Patient voiced understanding of the above risks as well as understanding of indications for blood transfusion.  Circumcision Consent:  Routine circumcisions performed on newborns have been identified as voluntary, elective procedures by [employer] such as the [employer] [facility].  It is considered an elective procedure with no definitive medical indication and carries risks. Risks include but are not limited to bleeding, infection, damage to penis with possible need for further surgery, poor cosmesis, and local anesthetic risks. Circumcision will only be performed if patient is deemed to have normal anatomy by his Pediatrician, meets adequate criteria for a newborn of similar gestational age after birth and is without infection or other medical issue contraindicating an elective procedure. Patient understands and agrees. Patient discussed with mother of infant.  Disposition:  D/C home later this evening or tomorrow, pending completion of blood transfusion.  Sweet Jarvis, DO

## 2024-02-23 NOTE — Progress Notes (Signed)
 CRITICAL VALUE STICKER  CRITICAL VALUE: Hgb 6.7  RECEIVER (on-site recipient of call): Kelicia Youtz, RN  DATE & TIME NOTIFIED: 02/23/2024 at 0650  MD NOTIFIED: Dr. Elisha Guillaume  TIME OF NOTIFICATION: 4098  RESPONSE: See new orders.   Zuleika Gallus, RN 02/23/24

## 2024-02-23 NOTE — Lactation Note (Signed)
 This note was copied from a baby's chart. Lactation Consultation Note  Patient Name: Stephanie Santos YNWGN'F Date: 02/23/2024 Age:34 hours  Reason for consult: Follow-up assessment;NICU baby;Early term 37-38.6wks;Primapara;1st time breastfeeding  P1, baby in NICU  Follow up LC visit with mother of baby Stephanie Kepner. Mother was receptive to visit. She is in the process of receiving blood transfusions. She has family present and she is engaging with conversation with LC and family. She reports she just pumped and has not expressed milk yet.  Mother encouraged to continue pumping every 3 hours or as often as possible given her current status. Mother inquired about getting a pump (stork pump) via DME while here in th hospital. Informed that we will follow up and update her tomorrow. Mother anticipates d/c tomorrow.    Feeding Mother's Current Feeding Choice: Breast Milk and Donor Milk   Lactation Tools Discussed/Used Pumping frequency: encouraged pumping every 3 hours in the initiation setting to stimulate milk production Pumped volume: 0 mL  Interventions Interventions: Education  Discharge Pump:  (LC will follow up on Stork pump referral on Monday)  Consult Status Consult Status: NICU follow-up Date: 02/24/24 Follow-up type: In-patient    Gearline Kell M 02/23/2024, 3:16 PM

## 2024-02-24 LAB — BPAM RBC
Blood Product Expiration Date: 202507112359
Blood Product Expiration Date: 202507112359
ISSUE DATE / TIME: 202506151211
ISSUE DATE / TIME: 202506151602
Unit Type and Rh: 5100
Unit Type and Rh: 5100

## 2024-02-24 LAB — TYPE AND SCREEN
ABO/RH(D): O POS
Antibody Screen: NEGATIVE
Unit division: 0
Unit division: 0

## 2024-02-24 NOTE — Plan of Care (Signed)

## 2024-02-24 NOTE — Discharge Instructions (Signed)

## 2024-02-24 NOTE — Lactation Note (Signed)
 This note was copied from a baby's chart. Lactation Consultation Note  Patient Name: Stephanie Santos VHQIO'N Date: 02/24/2024 Age:34 hours   LC asked RN to call when Mom returns to update her about her insurance pump.  Mom needs to contact [facility] for her pump.   Dario Edison 02/24/2024, 5:27 PM

## 2024-02-24 NOTE — Plan of Care (Signed)
 Problem: Education: Goal: Knowledge of General Education information will improve Description: Including pain rating scale, medication(s)/side effects and non-pharmacologic comfort measures 02/24/2024 0716 by Osvaldo Blender, LPN Outcome: Progressing 02/24/2024 0714 by Osvaldo Blender, LPN Outcome: Progressing   Problem: Health Behavior/Discharge Planning: Goal: Ability to manage health-related needs will improve 02/24/2024 0716 by Osvaldo Blender, LPN Outcome: Progressing 02/24/2024 0714 by Osvaldo Blender, LPN Outcome: Progressing   Problem: Clinical Measurements: Goal: Ability to maintain clinical measurements within normal limits will improve 02/24/2024 0716 by Osvaldo Blender, LPN Outcome: Progressing 02/24/2024 0714 by Osvaldo Blender, LPN Outcome: Progressing Goal: Will remain free from infection 02/24/2024 0716 by Osvaldo Blender, LPN Outcome: Progressing 02/24/2024 0714 by Osvaldo Blender, LPN Outcome: Progressing Goal: Diagnostic test results will improve 02/24/2024 0716 by Osvaldo Blender, LPN Outcome: Progressing 02/24/2024 0714 by Osvaldo Blender, LPN Outcome: Progressing Goal: Respiratory complications will improve 02/24/2024 0716 by Osvaldo Blender, LPN Outcome: Progressing 02/24/2024 0714 by Osvaldo Blender, LPN Outcome: Progressing Goal: Cardiovascular complication will be avoided 02/24/2024 0716 by Osvaldo Blender, LPN Outcome: Progressing 02/24/2024 0714 by Osvaldo Blender, LPN Outcome: Progressing   Problem: Activity: Goal: Risk for activity intolerance will decrease 02/24/2024 0716 by Osvaldo Blender, LPN Outcome: Progressing 02/24/2024 0714 by Osvaldo Blender, LPN Outcome: Progressing   Problem: Nutrition: Goal: Adequate nutrition will be maintained 02/24/2024 0716 by Osvaldo Blender, LPN Outcome: Progressing 02/24/2024 0714 by Osvaldo Blender, LPN Outcome: Progressing   Problem: Coping: Goal: Level of anxiety will decrease 02/24/2024 0716 by Osvaldo Blender, LPN Outcome:  Progressing 02/24/2024 0714 by Osvaldo Blender, LPN Outcome: Progressing   Problem: Elimination: Goal: Will not experience complications related to bowel motility 02/24/2024 0716 by Osvaldo Blender, LPN Outcome: Progressing 02/24/2024 0714 by Osvaldo Blender, LPN Outcome: Progressing Goal: Will not experience complications related to urinary retention 02/24/2024 0716 by Osvaldo Blender, LPN Outcome: Progressing 02/24/2024 0714 by Osvaldo Blender, LPN Outcome: Progressing   Problem: Pain Managment: Goal: General experience of comfort will improve and/or be controlled 02/24/2024 0716 by Osvaldo Blender, LPN Outcome: Progressing 02/24/2024 0714 by Osvaldo Blender, LPN Outcome: Progressing   Problem: Safety: Goal: Ability to remain free from injury will improve 02/24/2024 0716 by Osvaldo Blender, LPN Outcome: Progressing 02/24/2024 0714 by Osvaldo Blender, LPN Outcome: Progressing   Problem: Skin Integrity: Goal: Risk for impaired skin integrity will decrease 02/24/2024 0716 by Osvaldo Blender, LPN Outcome: Progressing 02/24/2024 0714 by Osvaldo Blender, LPN Outcome: Progressing   Problem: Education: Goal: Knowledge of condition will improve 02/24/2024 0716 by Osvaldo Blender, LPN Outcome: Progressing 02/24/2024 0714 by Osvaldo Blender, LPN Outcome: Progressing Goal: Individualized Educational Video(s) 02/24/2024 0716 by Osvaldo Blender, LPN Outcome: Progressing 02/24/2024 0714 by Osvaldo Blender, LPN Outcome: Progressing Goal: Individualized Newborn Educational Video(s) 02/24/2024 0716 by Osvaldo Blender, LPN Outcome: Progressing 02/24/2024 0714 by Osvaldo Blender, LPN Outcome: Progressing   Problem: Activity: Goal: Will verbalize the importance of balancing activity with adequate rest periods 02/24/2024 0716 by Osvaldo Blender, LPN Outcome: Progressing 02/24/2024 0714 by Osvaldo Blender, LPN Outcome: Progressing Goal: Ability to tolerate increased activity will improve 02/24/2024 0716 by Osvaldo Blender, LPN Outcome: Progressing 02/24/2024 0714 by Osvaldo Blender, LPN Outcome: Progressing   Problem: Coping: Goal: Ability to identify and utilize available resources and services will improve 02/24/2024 0716 by Osvaldo Blender, LPN Outcome: Progressing 02/24/2024 [phone]  by Osvaldo Blender, LPN Outcome: Progressing   Problem: Life Cycle: Goal: Chance of risk for complications during the postpartum period will decrease 02/24/2024 0716 by Osvaldo Blender, LPN Outcome: Progressing 02/24/2024 0714 by Osvaldo Blender, LPN Outcome: Progressing   Problem: Role Relationship: Goal: Ability to demonstrate positive interaction with newborn will improve 02/24/2024 0716 by Osvaldo Blender, LPN Outcome: Progressing 02/24/2024 0714 by Osvaldo Blender, LPN Outcome: Progressing   Problem: Skin Integrity: Goal: Demonstration of wound healing without infection will improve 02/24/2024 0716 by Osvaldo Blender, LPN Outcome: Progressing 02/24/2024 0714 by Osvaldo Blender, LPN Outcome: Progressing

## 2024-02-24 NOTE — Lactation Note (Signed)
 This note was copied from a baby's chart.  NICU Lactation Consultation Note  Patient Name: Stephanie Santos YNWGN'F Date: 02/24/2024 Age:34 hours  Reason for consult: Follow-up assessment; NICU baby; Primapara; 1st time breastfeeding; Early term 37-38.6wks  SUBJECTIVE  LC in to visit with P1 Mom of baby Stephanie Santos in the NICU.  Baby is on CPAP and receiving scheduled gavage feeds of DBM.  Mom has been consistently pumping every 3 hrs.  Encouraged STS.  No STORK pump delivered, LC called [employer] and left message to call back with information about whether insurance will qualify.  Mom states she is mainly pumping in NICU.  LC plans to go to baby's room and make sure a washing and drying basin are set up and sanitizing bag available for the pump parts.  OBJECTIVE Infant data: Mother's Current Feeding Choice: Breast Milk and Donor Milk  O2 Device: CPAP FiO2 (%): 21 %  Infant feeding assessment IDFTS - Readiness: 5 (CPAP 5- but showed cues)   Maternal data: G1P0000 Vaginal, Spontaneous Pumping frequency: 8 times per 24 hrs Pumped volume: 0 mL (drops) Flange Size: 21; 24  Pump: Referral sent for Stork Pump (Called [employer] [facility] to inquire 6/16)  ASSESSMENT Infant:   Feeding Status: Scheduled 8-11-2-5 Feeding method: Tube/Gavage (Bolus)  Maternal: Milk volume: Normal  INTERVENTIONS/PLAN Interventions: Interventions: Education Discharge Education: Engorgement and breast care Tools: Pump; Flanges Pump Education: Setup, frequency, and cleaning; Milk Storage  Plan: Consult Status: NICU follow-up NICU Follow-up type: Verify absence of engorgement; Verify onset of copious milk; Verify DEBP issuance; Maternal D/C visit   Stephanie Santos 02/24/2024, 10:03 AM

## 2024-02-24 NOTE — Plan of Care (Signed)
 Problem: Education: Goal: Knowledge of General Education information will improve Description: Including pain rating scale, medication(s)/side effects and non-pharmacologic comfort measures 02/24/2024 0958 by Osvaldo Blender, LPN Outcome: Adequate for Discharge 02/24/2024 0716 by Osvaldo Blender, LPN Outcome: Progressing 02/24/2024 0714 by Osvaldo Blender, LPN Outcome: Progressing   Problem: Health Behavior/Discharge Planning: Goal: Ability to manage health-related needs will improve 02/24/2024 0958 by Osvaldo Blender, LPN Outcome: Adequate for Discharge 02/24/2024 0716 by Osvaldo Blender, LPN Outcome: Progressing 02/24/2024 0714 by Osvaldo Blender, LPN Outcome: Progressing   Problem: Clinical Measurements: Goal: Ability to maintain clinical measurements within normal limits will improve 02/24/2024 0958 by Osvaldo Blender, LPN Outcome: Adequate for Discharge 02/24/2024 0716 by Osvaldo Blender, LPN Outcome: Progressing 02/24/2024 0714 by Osvaldo Blender, LPN Outcome: Progressing Goal: Will remain free from infection 02/24/2024 0958 by Osvaldo Blender, LPN Outcome: Adequate for Discharge 02/24/2024 0716 by Osvaldo Blender, LPN Outcome: Progressing 02/24/2024 0714 by Osvaldo Blender, LPN Outcome: Progressing Goal: Diagnostic test results will improve 02/24/2024 0958 by Osvaldo Blender, LPN Outcome: Adequate for Discharge 02/24/2024 0716 by Osvaldo Blender, LPN Outcome: Progressing 02/24/2024 0714 by Osvaldo Blender, LPN Outcome: Progressing Goal: Respiratory complications will improve 02/24/2024 0958 by Osvaldo Blender, LPN Outcome: Adequate for Discharge 02/24/2024 0716 by Osvaldo Blender, LPN Outcome: Progressing 02/24/2024 0714 by Osvaldo Blender, LPN Outcome: Progressing Goal: Cardiovascular complication will be avoided 02/24/2024 0958 by Osvaldo Blender, LPN Outcome: Adequate for Discharge 02/24/2024 0716 by Osvaldo Blender, LPN Outcome: Progressing 02/24/2024 0714 by Osvaldo Blender, LPN Outcome:  Progressing   Problem: Activity: Goal: Risk for activity intolerance will decrease 02/24/2024 0958 by Osvaldo Blender, LPN Outcome: Adequate for Discharge 02/24/2024 0716 by Osvaldo Blender, LPN Outcome: Progressing 02/24/2024 0714 by Osvaldo Blender, LPN Outcome: Progressing   Problem: Nutrition: Goal: Adequate nutrition will be maintained 02/24/2024 0958 by Osvaldo Blender, LPN Outcome: Adequate for Discharge 02/24/2024 0716 by Osvaldo Blender, LPN Outcome: Progressing 02/24/2024 0714 by Osvaldo Blender, LPN Outcome: Progressing   Problem: Coping: Goal: Level of anxiety will decrease 02/24/2024 0958 by Osvaldo Blender, LPN Outcome: Adequate for Discharge 02/24/2024 0716 by Osvaldo Blender, LPN Outcome: Progressing 02/24/2024 0714 by Osvaldo Blender, LPN Outcome: Progressing   Problem: Elimination: Goal: Will not experience complications related to bowel motility 02/24/2024 0958 by Osvaldo Blender, LPN Outcome: Adequate for Discharge 02/24/2024 0716 by Osvaldo Blender, LPN Outcome: Progressing 02/24/2024 0714 by Osvaldo Blender, LPN Outcome: Progressing Goal: Will not experience complications related to urinary retention 02/24/2024 0958 by Osvaldo Blender, LPN Outcome: Adequate for Discharge 02/24/2024 0716 by Osvaldo Blender, LPN Outcome: Progressing 02/24/2024 0714 by Osvaldo Blender, LPN Outcome: Progressing   Problem: Pain Managment: Goal: General experience of comfort will improve and/or be controlled 02/24/2024 0958 by Osvaldo Blender, LPN Outcome: Adequate for Discharge 02/24/2024 0716 by Osvaldo Blender, LPN Outcome: Progressing 02/24/2024 0714 by Osvaldo Blender, LPN Outcome: Progressing   Problem: Safety: Goal: Ability to remain free from injury will improve 02/24/2024 0958 by Osvaldo Blender, LPN Outcome: Adequate for Discharge 02/24/2024 0716 by Osvaldo Blender, LPN Outcome: Progressing 02/24/2024 0714 by Osvaldo Blender, LPN Outcome: Progressing   Problem: Skin Integrity: Goal:  Risk for impaired skin integrity will decrease 02/24/2024 0958 by Osvaldo Blender, LPN Outcome: Adequate for Discharge 02/24/2024 0716 by Osvaldo Blender, LPN Outcome: Progressing 02/24/2024 0714 by Allyn Ivy  Rhesa Celeste, LPN Outcome: Progressing   Problem: Education: Goal: Knowledge of condition will improve 02/24/2024 0958 by Osvaldo Blender, LPN Outcome: Adequate for Discharge 02/24/2024 0716 by Osvaldo Blender, LPN Outcome: Progressing 02/24/2024 0714 by Osvaldo Blender, LPN Outcome: Progressing Goal: Individualized Educational Video(s) 02/24/2024 0958 by Osvaldo Blender, LPN Outcome: Adequate for Discharge 02/24/2024 0716 by Osvaldo Blender, LPN Outcome: Progressing 02/24/2024 0714 by Osvaldo Blender, LPN Outcome: Progressing Goal: Individualized Newborn Educational Video(s) 02/24/2024 0958 by Osvaldo Blender, LPN Outcome: Adequate for Discharge 02/24/2024 0716 by Osvaldo Blender, LPN Outcome: Progressing 02/24/2024 0714 by Osvaldo Blender, LPN Outcome: Progressing   Problem: Activity: Goal: Will verbalize the importance of balancing activity with adequate rest periods 02/24/2024 0958 by Osvaldo Blender, LPN Outcome: Adequate for Discharge 02/24/2024 0716 by Osvaldo Blender, LPN Outcome: Progressing 02/24/2024 0714 by Osvaldo Blender, LPN Outcome: Progressing Goal: Ability to tolerate increased activity will improve 02/24/2024 0958 by Osvaldo Blender, LPN Outcome: Adequate for Discharge 02/24/2024 0716 by Osvaldo Blender, LPN Outcome: Progressing 02/24/2024 0714 by Osvaldo Blender, LPN Outcome: Progressing   Problem: Coping: Goal: Ability to identify and utilize available resources and services will improve 02/24/2024 0958 by Osvaldo Blender, LPN Outcome: Adequate for Discharge 02/24/2024 0716 by Osvaldo Blender, LPN Outcome: Progressing 02/24/2024 0714 by Osvaldo Blender, LPN Outcome: Progressing   Problem: Life Cycle: Goal: Chance of risk for complications during the postpartum period will  decrease 02/24/2024 0958 by Osvaldo Blender, LPN Outcome: Adequate for Discharge 02/24/2024 0716 by Osvaldo Blender, LPN Outcome: Progressing 02/24/2024 0714 by Osvaldo Blender, LPN Outcome: Progressing   Problem: Role Relationship: Goal: Ability to demonstrate positive interaction with newborn will improve 02/24/2024 0958 by Osvaldo Blender, LPN Outcome: Adequate for Discharge 02/24/2024 0716 by Osvaldo Blender, LPN Outcome: Progressing 02/24/2024 0714 by Osvaldo Blender, LPN Outcome: Progressing   Problem: Skin Integrity: Goal: Demonstration of wound healing without infection will improve 02/24/2024 0958 by Osvaldo Blender, LPN Outcome: Adequate for Discharge 02/24/2024 0716 by Osvaldo Blender, LPN Outcome: Progressing 02/24/2024 0714 by Osvaldo Blender, LPN Outcome: Progressing

## 2024-02-24 NOTE — Discharge Summary (Signed)
 Postpartum Discharge Summary  Date of Service updated***     Patient Name: Stephanie Santos DOB: [birth year] MRN: [id number]  Date of admission: 02/21/2024 Delivery date:02/21/2024 Delivering provider: Thana Filter Date of discharge: 02/24/2024  Admitting diagnosis: Normal labor [O80, Z37.9] Intrauterine pregnancy: [redacted]w[redacted]d     Secondary diagnosis:  Principal Problem:   Normal labor Active Problems:   Postpartum hemorrhage  Additional problems: ***    Discharge diagnosis: {DX.:23714}                                              Post partum procedures:{Postpartum procedures:23558} Augmentation: {Augmentation:20782} Complications: {OB Labor/Delivery Complications:20784}  Hospital course: {Courses:23701}  Magnesium Sulfate received: {Mag received:30440022} BMZ received: {BMZ received:30440023} Rhophylac:{Rhophylac received:30440032} NWG:{NFA:21308657} T-DaP:{Tdap:23962} Flu: {QIO:96295} RSV Vaccine received: {RSV:31013} Transfusion:{Transfusion received:30440034} Immunizations administered: Immunization History  Administered Date(s) Administered   Tdap 01/25/2015, 01/23/2024    Physical exam  Vitals:   02/23/24 1626 02/23/24 1835 02/23/24 2200 02/24/24 0450  BP: 117/72 119/69 111/65 107/61  Pulse: 69 79 77   Resp: 17 18 18 18   Temp: 98.5 F (36.9 C) 97.8 F (36.6 C) 97.9 F (36.6 C) 98 F (36.7 C)  TempSrc: Oral Oral Axillary Oral  SpO2:  100% 99% 100%  Weight:      Height:       General: {Exam; general:21111117} Lochia: {Desc; appropriate/inappropriate:30686::appropriate} Uterine Fundus: {Desc; firm/soft:30687} Incision: {Exam; incision:21111123} DVT Evaluation: {Exam; dvt:2111122} Labs: Lab Results  Component Value Date   WBC 14.4 (H) 02/23/2024   HGB 8.9 (L) 02/23/2024   HCT 28.3 (L) 02/23/2024   MCV 68.0 (L) 02/23/2024   PLT 130 (L) 02/23/2024      Latest Ref Rng & Units 11/23/2021    3:49 PM  CMP  Glucose 70 - 99 mg/dL 76   BUN 6 - 20  mg/dL 11   Creatinine 2.84 - 1.00 mg/dL 1.32   Sodium 440 - 102 mmol/L 140   Potassium 3.5 - 5.2 mmol/L 4.5   Chloride 96 - 106 mmol/L 104   CO2 20 - 29 mmol/L 23   Calcium 8.7 - 10.2 mg/dL 9.0   Total Protein 6.0 - 8.5 g/dL 7.3   Total Bilirubin 0.0 - 1.2 mg/dL 0.3   Alkaline Phos 44 - 121 IU/L 82   AST 0 - 40 IU/L 15   ALT 0 - 32 IU/L 8    Edinburgh Score:    02/22/2024    7:43 PM  Edinburgh Postnatal Depression Scale Screening Tool  I have been able to laugh and see the funny side of things. 0  I have looked forward with enjoyment to things. 0  I have blamed myself unnecessarily when things went wrong. 2  I have been anxious or worried for no good reason. 0  I have felt scared or panicky for no good reason. 0  Things have been getting on top of me. 0  I have been so unhappy that I have had difficulty sleeping. 0  I have felt sad or miserable. 0  I have been so unhappy that I have been crying. 0  The thought of harming myself has occurred to me. 0  Edinburgh Postnatal Depression Scale Total 2      After visit meds:  Allergies as of 02/24/2024   No Known Allergies      Medication List  STOP taking these medications    cyclobenzaprine  10 MG tablet Commonly known as: FLEXERIL    NIFEdipine  10 MG capsule Commonly known as: PROCARDIA        TAKE these medications    ferrous sulfate 325 (65 FE) MG tablet Take 1 tablet (325 mg total) by mouth 2 (two) times daily with a meal.   ibuprofen  600 MG tablet Commonly known as: ADVIL  Take 1 tablet (600 mg total) by mouth every 6 (six) hours.   PRENATAL PO Take by mouth.         Discharge home in stable condition Infant Feeding: {Baby feeding:23562} Infant Disposition:{CHL IP OB HOME WITH UJWJXB:14782} Discharge instruction: per After Visit Summary and Postpartum booklet. Activity: Advance as tolerated. Pelvic rest for 6 weeks.  Diet: {OB diet:21111121} Anticipated Birth Control: {Birth  Control:23956} Postpartum Appointment:{Outpatient follow up:23559} Additional Postpartum F/U: {PP Procedure:23957} Future Appointments:No future appointments. Follow up Visit:  Follow-up Information     Arlee Lace, MD Follow up in 1 week(s).   Specialty: Obstetrics and Gynecology Why: Our office will arrange your 6 week postpartum visit. Contact information: [address] [place] [address] [phone]                     02/24/2024 Arlee Lace, MD

## 2024-02-25 ENCOUNTER — Inpatient Hospital Stay (HOSPITAL_COMMUNITY): Admission: RE | Admit: 2024-02-25 | Source: Home / Self Care | Admitting: Obstetrics and Gynecology

## 2024-02-25 ENCOUNTER — Inpatient Hospital Stay (HOSPITAL_COMMUNITY)

## 2024-02-25 LAB — SURGICAL PATHOLOGY

## 2024-02-25 NOTE — Lactation Note (Signed)
 This note was copied from a baby's chart.  NICU Lactation Consultation Note  Patient Name: Stephanie Santos JYNWG'N Date: 02/25/2024 Age:34 days  Reason for consult: Follow-up assessment; NICU baby; Mother's request; RN request; Early term 65-38.6wks; Primapara; 1st time breastfeeding; Other (Comment) (Suspected trisomy 76)  SUBJECTIVE Visited with family of 30 77/39 weeks old NICU female Stephanie Santos; Stephanie Santos reported she got discharged with a hand pump and was having a hard time using it. She called [facility] but she was told she won't be getting her pump for another 8-10 business days. Loaner pump provided along with [facility] referral to [facility]. Noticed that pumping hasn't been consistent, probably due to the lack of a DEBP. Reviewed pump settings and the importance of frequent and consistent pumping for the onset of lactogenesis II and the prevention of engorgement to develop a full supply.  OBJECTIVE Infant data: Mother's Current Feeding Choice: Breast Milk and Donor Milk  O2 Device: HHFNC O2 Flow Rate (L/min): 4 L/min FiO2 (%): 21 %  Infant feeding assessment IDFTS - Readiness: 5 (HHFNC 4L)   Maternal data: G1P0000 Vaginal, Spontaneous Pumping frequency: 4-5 times/24 hours Pumped volume: 4 mL Flange Size: 21; 24  WIC Program: No WIC Referral Sent?: Yes What county?: [place] Pump: [facility] Loaner (Loaner pump # [id number])  ASSESSMENT Infant: Feeding Status: Scheduled 8-11-2-5 Feeding method: Tube/Gavage (Bolus)  Maternal: Milk volume: Low  INTERVENTIONS/PLAN Interventions: Interventions: Breast feeding basics reviewed; Coconut oil; DEBP; Education Discharge Education: Engorgement and breast care Tools: Pump; Flanges Pump Education: Setup, frequency, and cleaning; Milk Storage  Plan: STS around care times Pump both breasts on initiate mode every 3 hours for 15 minutes; ideally 8 times/24 hours Switch to maintain mode once expressing +20 ml of EBM combined or by day  5; whichever happens first   FOB present and supportive. All questions and concerns answered, family to contact [facility] services PRN.  Consult Status: NICU follow-up NICU Follow-up type: Weekly NICU follow up; Verify absence of engorgement; Verify onset of copious milk   Koula Venier S Eisley Barber 02/25/2024, 8:04 PM

## 2024-03-02 ENCOUNTER — Telehealth (HOSPITAL_COMMUNITY): Payer: Self-pay | Admitting: *Deleted

## 2024-03-02 NOTE — Telephone Encounter (Signed)
 03/02/2024  Name: Stephanie Santos MRN: [id number] DOB: 1991/08/12  Reason for Call:  Transition of Care Hospital Discharge Call  Contact Status: Patient Contact Status: Complete  Language assistant needed: Interpreter Mode: Interpreter Not Needed        Follow-Up Questions: Do You Have Any Concerns About Your Health As You Heal From Delivery?: No Do You Have Any Concerns About Your Infants Health?: Infant in NICU  Edinburgh Postnatal Depression Scale:  In the Past 7 Days: I have been able to laugh and see the funny side of things.: As much as I always could I have looked forward with enjoyment to things.: As much as I ever did I have blamed myself unnecessarily when things went wrong.: Not very often I have been anxious or worried for no good reason.: No, not at all I have felt scared or panicky for no good reason.: No, not at all Things have been getting on top of me.: No, I have been coping as well as ever I have been so unhappy that I have had difficulty sleeping.: Not at all I have felt sad or miserable.: No, not at all I have been so unhappy that I have been crying.: No, never The thought of harming myself has occurred to me.: Never Edinburgh Postnatal Depression Scale Total: 1  PHQ2-9 Depression Scale:     Discharge Follow-up: Edinburgh score requires follow up?: No Patient was advised of the following resources:: Support Group, Breastfeeding Support Group  Post-discharge interventions: Reviewed Newborn Safe Sleep Practices  Mliss Sieve, RN 03/02/2024 13:16

## 2024-08-17 DIAGNOSIS — D219 Benign neoplasm of connective and other soft tissue, unspecified: Secondary | ICD-10-CM | POA: Diagnosis not present
